# Patient Record
Sex: Male | Born: 1960 | Race: White | Hispanic: No | Marital: Married | Smoking: Never smoker
Health system: Southern US, Community
[De-identification: ages and names within clinical notes are randomized; demographics above are authoritative.]

## PROBLEM LIST (undated history)

## (undated) DIAGNOSIS — N400 Enlarged prostate without lower urinary tract symptoms: Secondary | ICD-10-CM

## (undated) DIAGNOSIS — R519 Headache, unspecified: Secondary | ICD-10-CM

## (undated) DIAGNOSIS — N390 Urinary tract infection, site not specified: Principal | ICD-10-CM

## (undated) DIAGNOSIS — A419 Sepsis, unspecified organism: Principal | ICD-10-CM

## (undated) DIAGNOSIS — T7840XA Allergy, unspecified, initial encounter: Secondary | ICD-10-CM

## (undated) DIAGNOSIS — M1990 Unspecified osteoarthritis, unspecified site: Secondary | ICD-10-CM

## (undated) DIAGNOSIS — R972 Elevated prostate specific antigen [PSA]: Secondary | ICD-10-CM

## (undated) DIAGNOSIS — Z86010 Personal history of colonic polyps: Secondary | ICD-10-CM

## (undated) HISTORY — DX: Urinary tract infection, site not specified: N39.0

## (undated) HISTORY — DX: Allergy, unspecified, initial encounter: T78.40XA

## (undated) HISTORY — DX: Sepsis, unspecified organism: A41.9

## (undated) HISTORY — DX: Elevated prostate specific antigen (PSA): R97.20

## (undated) HISTORY — DX: Benign prostatic hyperplasia without lower urinary tract symptoms: N40.0

## (undated) HISTORY — DX: Headache, unspecified: R51.9

## (undated) HISTORY — DX: Personal history of colonic polyps: Z86.010

## (undated) HISTORY — DX: Unspecified osteoarthritis, unspecified site: M19.90

---

## 1994-08-12 HISTORY — PX: HERNIA REPAIR: SHX51

## 1999-09-09 ENCOUNTER — Emergency Department (HOSPITAL_COMMUNITY): Admission: EM | Admit: 1999-09-09 | Discharge: 1999-09-09 | Payer: Self-pay

## 2005-07-15 ENCOUNTER — Ambulatory Visit: Payer: Self-pay | Admitting: Family Medicine

## 2006-01-01 ENCOUNTER — Ambulatory Visit: Payer: Self-pay | Admitting: Family Medicine

## 2006-03-04 ENCOUNTER — Ambulatory Visit: Payer: Self-pay | Admitting: Family Medicine

## 2006-03-21 ENCOUNTER — Ambulatory Visit: Payer: Self-pay | Admitting: Family Medicine

## 2006-06-10 ENCOUNTER — Ambulatory Visit: Payer: Self-pay | Admitting: Family Medicine

## 2006-08-15 ENCOUNTER — Ambulatory Visit: Payer: Self-pay | Admitting: Family Medicine

## 2006-08-15 LAB — CONVERTED CEMR LAB
ALT: 29 units/L (ref 0–40)
Albumin: 3.9 g/dL (ref 3.5–5.2)
Alkaline Phosphatase: 62 units/L (ref 39–117)
Basophils Absolute: 0 10*3/uL (ref 0.0–0.1)
CO2: 30 meq/L (ref 19–32)
Calcium: 9.3 mg/dL (ref 8.4–10.5)
Cholesterol: 189 mg/dL (ref 0–200)
Creatinine, Ser: 1.1 mg/dL (ref 0.4–1.5)
GFR calc non Af Amer: 77 mL/min
HCT: 44.1 % (ref 39.0–52.0)
Lymphocytes Relative: 34.2 % (ref 12.0–46.0)
Total Bilirubin: 1.1 mg/dL (ref 0.3–1.2)
Triglyceride fasting, serum: 159 mg/dL — ABNORMAL HIGH (ref 0–149)
VLDL: 32 mg/dL (ref 0–40)
WBC: 6.2 10*3/uL (ref 4.5–10.5)

## 2006-08-22 ENCOUNTER — Ambulatory Visit: Payer: Self-pay | Admitting: Family Medicine

## 2006-09-03 ENCOUNTER — Encounter: Admission: RE | Admit: 2006-09-03 | Discharge: 2006-09-03 | Payer: Self-pay | Admitting: Family Medicine

## 2007-05-15 ENCOUNTER — Ambulatory Visit: Payer: Self-pay | Admitting: Family Medicine

## 2007-05-15 DIAGNOSIS — J019 Acute sinusitis, unspecified: Secondary | ICD-10-CM

## 2007-08-27 IMAGING — US US SCROTUM
1 series · 14 of 25 positions shown · non-contrast
Comparison: None.

CLINICAL DATA: Right scrotal mass. 
 SCROTAL ULTRASOUND:
TECHNIQUE: Complete ultrasound examination of the testicles, epididymis, and other scrotal structures was performed.

[Series 1: us scrotum · 0.08mm/px · 14 of 43 slices shown]
[im 1/43]
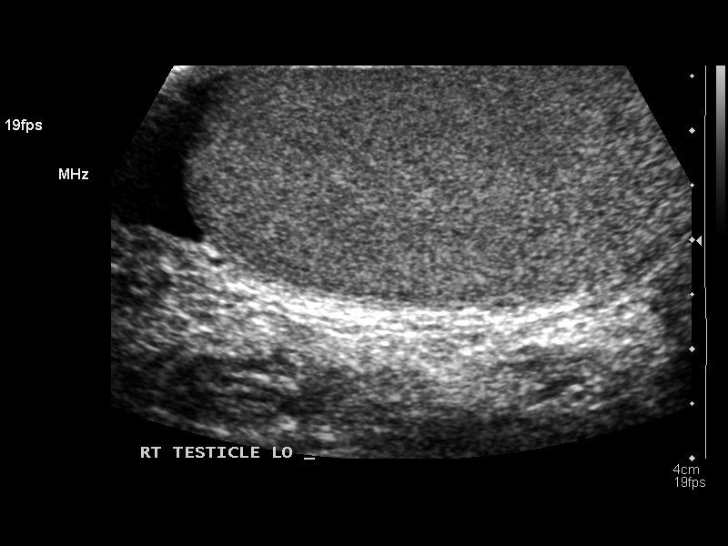
[im 4/43]
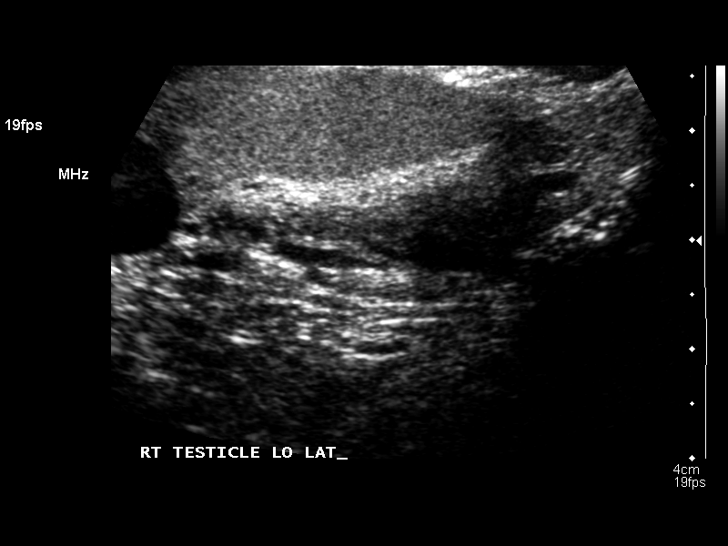
[im 8/43]
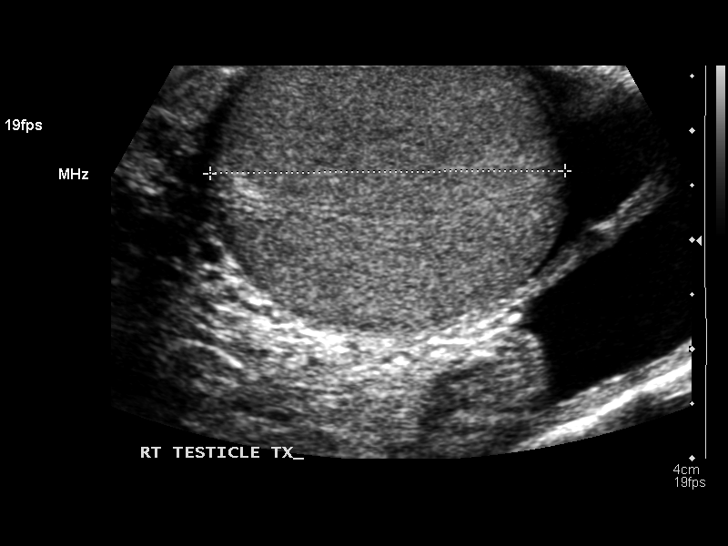
[im 11/43]
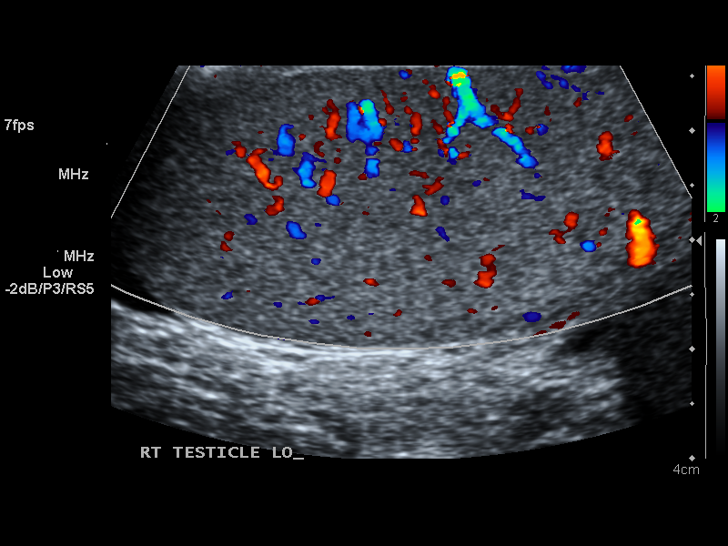
[im 15/43]
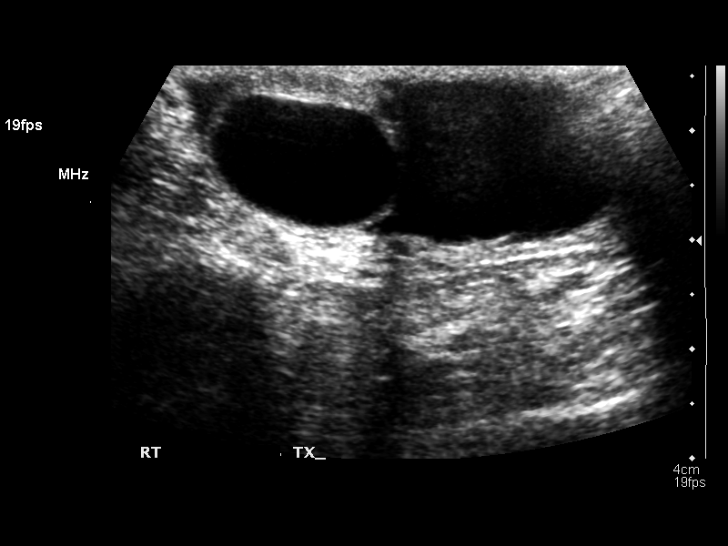
[im 16/43]
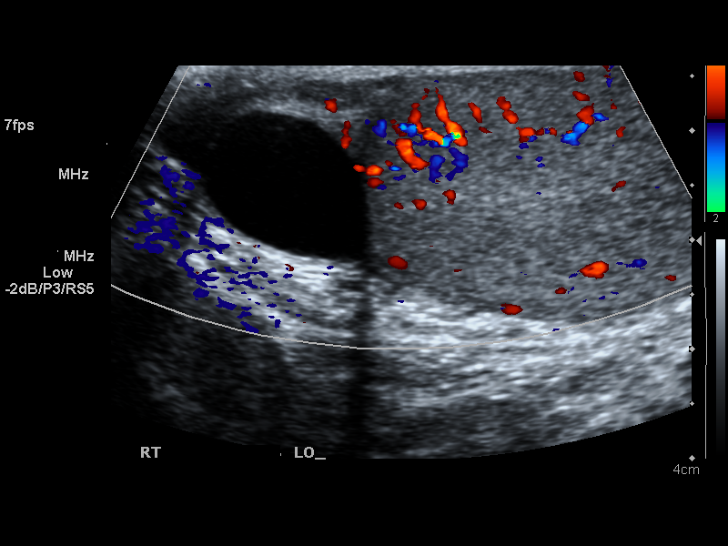
[im 20/43]
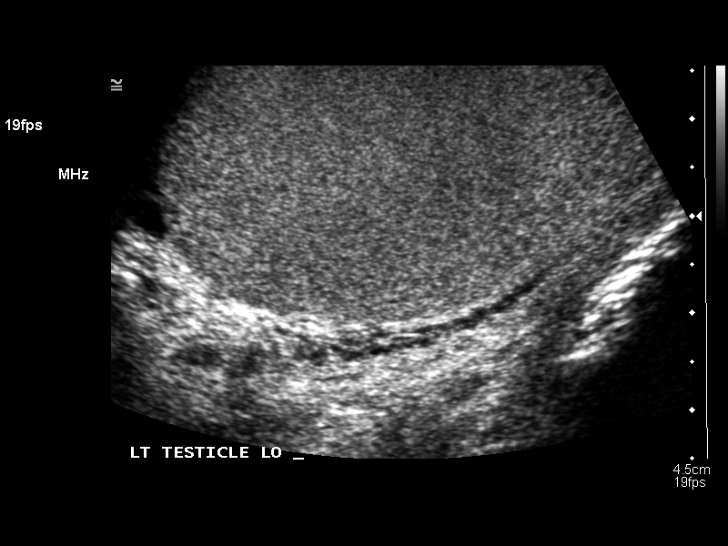
[im 23/43]
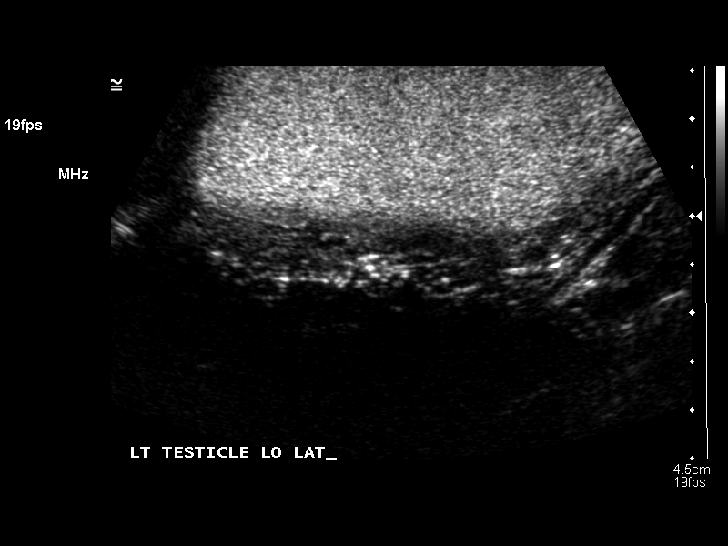
[im 27/43]
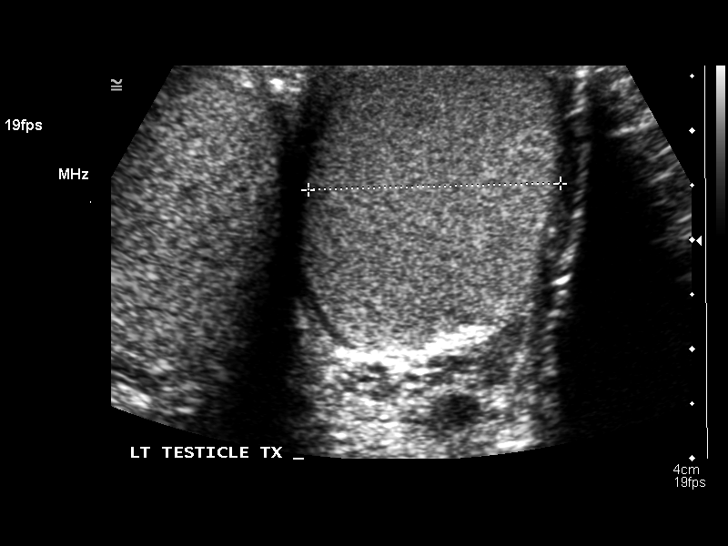
[im 29/43]
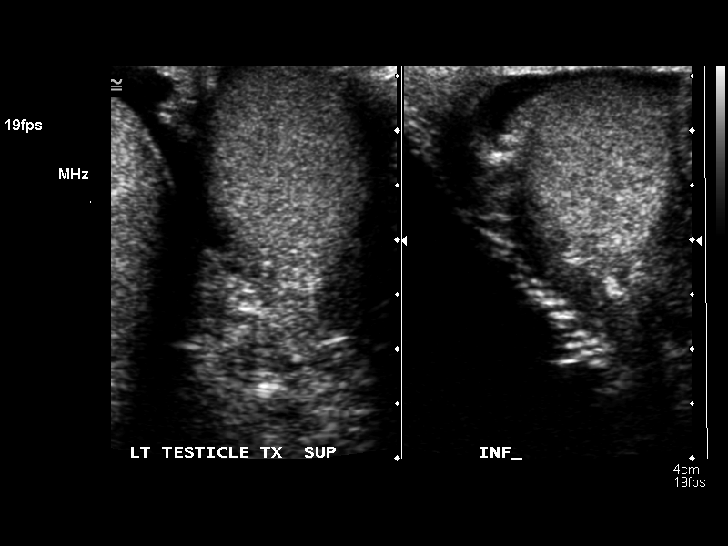
[im 32/43]
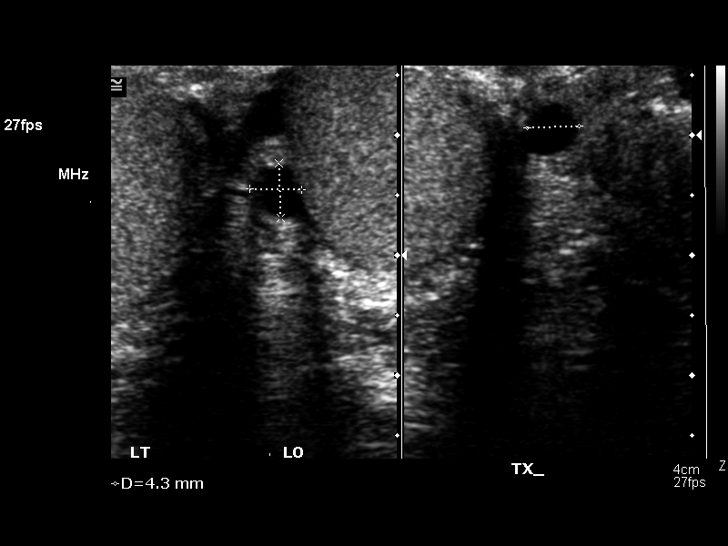
[im 36/43]
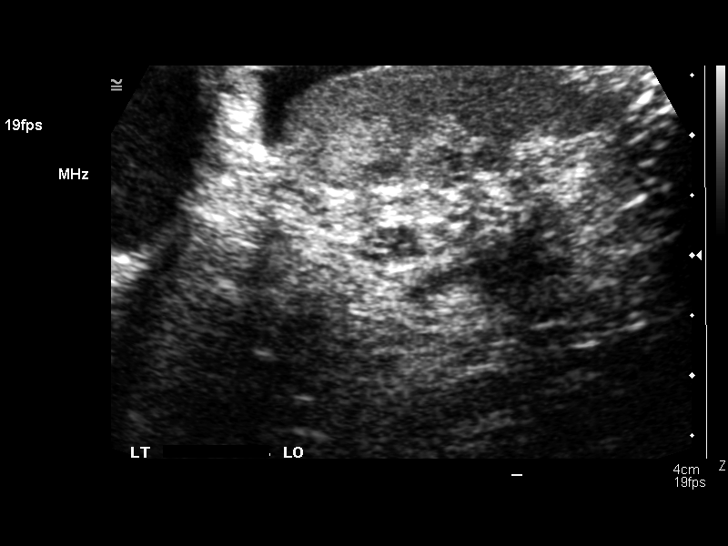
[im 39/43]
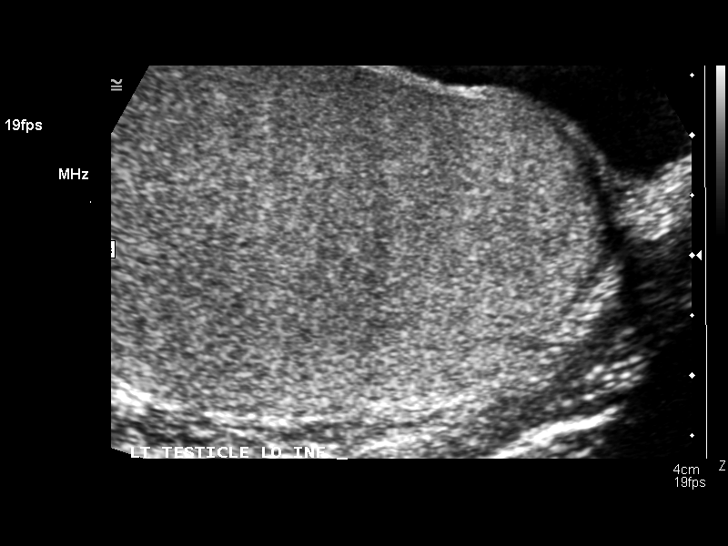
[im 43/43]
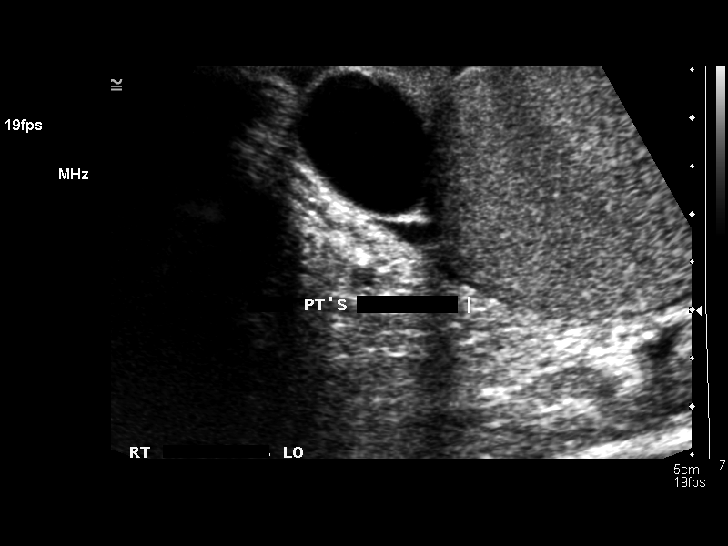

[14 of 25 positions shown; findings below may reference images not displayed]

FINDINGS: Right testicle measures 5.4 x 2.4 x 3.3 cm.  Flow is seen within.  Parenchymal echo texture is uniform.  There is 1.8 x 1.3 x 1.8 cm anechoic lesion with increased through transmission in the right epididymis.  A small amount of fluid is seen in the right scrotal sac.  
 Left testicle measures 5.5 x 2.9 x 2.3 cm.  Flow is seen within.  Parenchymal echo texture is uniform.  There are two subcentimeter anechoic lesions with increased through transmission in the epididymis.  One of these has internal echoes.  A small amount of fluid is seen about the left testicle.  No varicocele bilaterally.
IMPRESSION: 1.  Bilateral spermatoceles or epididymal cysts.   One of these contains internal debris on the left. 
 2.  Small bilateral hydroceles.

## 2007-09-14 ENCOUNTER — Ambulatory Visit: Payer: Self-pay | Admitting: Family Medicine

## 2007-09-14 DIAGNOSIS — IMO0002 Reserved for concepts with insufficient information to code with codable children: Secondary | ICD-10-CM

## 2008-03-03 ENCOUNTER — Telehealth: Payer: Self-pay | Admitting: *Deleted

## 2008-03-04 ENCOUNTER — Ambulatory Visit: Payer: Self-pay | Admitting: Internal Medicine

## 2008-03-04 DIAGNOSIS — R079 Chest pain, unspecified: Secondary | ICD-10-CM | POA: Insufficient documentation

## 2008-07-03 ENCOUNTER — Emergency Department (HOSPITAL_COMMUNITY): Admission: EM | Admit: 2008-07-03 | Discharge: 2008-07-03 | Payer: Self-pay | Admitting: Emergency Medicine

## 2008-07-04 ENCOUNTER — Ambulatory Visit: Payer: Self-pay | Admitting: Family Medicine

## 2008-07-04 DIAGNOSIS — N401 Enlarged prostate with lower urinary tract symptoms: Secondary | ICD-10-CM

## 2008-08-12 HISTORY — PX: NASAL SEPTUM SURGERY: SHX37

## 2008-09-12 ENCOUNTER — Encounter: Payer: Self-pay | Admitting: Family Medicine

## 2009-01-02 ENCOUNTER — Ambulatory Visit: Payer: Self-pay | Admitting: Family Medicine

## 2009-01-02 LAB — CONVERTED CEMR LAB
Nitrite: NEGATIVE
Specific Gravity, Urine: 1.02

## 2009-01-05 ENCOUNTER — Encounter: Payer: Self-pay | Admitting: Family Medicine

## 2009-01-05 LAB — CONVERTED CEMR LAB
BUN: 12 mg/dL (ref 6–23)
Basophils Absolute: 0 10*3/uL (ref 0.0–0.1)
Bilirubin, Direct: 0.2 mg/dL (ref 0.0–0.3)
CO2: 28 meq/L (ref 19–32)
Calcium: 8.9 mg/dL (ref 8.4–10.5)
Chloride: 107 meq/L (ref 96–112)
Cholesterol: 195 mg/dL (ref 0–200)
Creatinine, Ser: 1 mg/dL (ref 0.4–1.5)
GFR calc non Af Amer: 84.4 mL/min (ref >60)
Glucose, Bld: 87 mg/dL (ref 70–99)
Hemoglobin: 14.8 g/dL (ref 13.0–17.0)
Monocytes Absolute: 0.4 10*3/uL (ref 0.1–1.0)
Monocytes Relative: 8.2 % (ref 3.0–12.0)
Neutrophils Relative %: 49.5 % (ref 43.0–77.0)
Platelets: 190 10*3/uL (ref 150.0–400.0)
Potassium: 3.8 meq/L (ref 3.5–5.1)
RBC: 4.64 M/uL (ref 4.22–5.81)
RDW: 12.4 % (ref 11.5–14.6)
Sodium: 141 meq/L (ref 135–145)
TSH: 2.46 microintl units/mL (ref 0.35–5.50)
Total Bilirubin: 1.1 mg/dL (ref 0.3–1.2)
Total CHOL/HDL Ratio: 6
Total Protein: 6.7 g/dL (ref 6.0–8.3)
VLDL: 30.8 mg/dL (ref 0.0–40.0)
WBC: 5.1 10*3/uL (ref 4.5–10.5)

## 2009-01-17 ENCOUNTER — Ambulatory Visit: Payer: Self-pay | Admitting: Family Medicine

## 2009-07-10 ENCOUNTER — Ambulatory Visit: Payer: Self-pay | Admitting: Family Medicine

## 2009-07-10 DIAGNOSIS — J3489 Other specified disorders of nose and nasal sinuses: Secondary | ICD-10-CM

## 2009-07-13 ENCOUNTER — Encounter (INDEPENDENT_AMBULATORY_CARE_PROVIDER_SITE_OTHER): Payer: Self-pay | Admitting: *Deleted

## 2009-08-09 ENCOUNTER — Encounter: Payer: Self-pay | Admitting: Family Medicine

## 2009-08-10 ENCOUNTER — Encounter: Payer: Self-pay | Admitting: Family Medicine

## 2009-08-18 ENCOUNTER — Encounter: Payer: Self-pay | Admitting: Family Medicine

## 2009-08-28 ENCOUNTER — Encounter: Payer: Self-pay | Admitting: Family Medicine

## 2009-09-29 ENCOUNTER — Encounter: Payer: Self-pay | Admitting: Family Medicine

## 2009-10-19 ENCOUNTER — Encounter: Payer: Self-pay | Admitting: Family Medicine

## 2009-12-27 ENCOUNTER — Ambulatory Visit: Payer: Self-pay | Admitting: Family Medicine

## 2010-09-13 NOTE — Letter (Signed)
Summary: [facility]  [facility]   Imported By: Maryln Gottron 10/25/2009 09:23:01  _____________________________________________________________________  External Attachment:    Type:   Image     Comment:   External Document

## 2010-09-13 NOTE — Letter (Signed)
Summary: [facility]  [facility]   Imported By: Maryln Gottron 08/25/2009 13:46:27  _____________________________________________________________________  External Attachment:    Type:   Image     Comment:   External Document

## 2010-09-13 NOTE — Assessment & Plan Note (Signed)
Summary: sinus/headache/sorethroat/cjr   Vital Signs:  Patient profile:   50 year old male Weight:      174 pounds BMI:     25.79 Temp:     98.9 degrees F oral BP sitting:   110 / 86  (left arm) Cuff size:   regular  Vitals Entered By: Raechel Ache, RN (Dec 27, 2009 2:55 PM) CC: C/o head congestion, sore throat, drainage and some cough x 3 weeks.   History of Present Illness: Here for 2 weeks of sinus pressure, PND, HA, dry cough, and blowing yellow mucus from the nose. No fever. He saw Urgent Care and was given Omnicef, but this did not help.   Allergies (verified): No Known Drug Allergies  Past History:  Past Medical History: Reviewed history from 01/17/2009 and no changes required. Unremarkable  Past Surgical History: Hernia inguinal Sinus surgery  Review of Systems  The patient denies anorexia, fever, weight loss, weight gain, vision loss, decreased hearing, hoarseness, chest pain, syncope, dyspnea on exertion, peripheral edema, hemoptysis, abdominal pain, melena, hematochezia, severe indigestion/heartburn, hematuria, incontinence, genital sores, muscle weakness, suspicious skin lesions, transient blindness, difficulty walking, depression, unusual weight change, abnormal bleeding, enlarged lymph nodes, angioedema, breast masses, and testicular masses.    Physical Exam  General:  Well-developed,well-nourished,in no acute distress; alert,appropriate and cooperative throughout examination Head:  Normocephalic and atraumatic without obvious abnormalities. No apparent alopecia or balding. Eyes:  No corneal or conjunctival inflammation noted. EOMI. Perrla. Funduscopic exam benign, without hemorrhages, exudates or papilledema. Vision grossly normal. Ears:  External ear exam shows no significant lesions or deformities.  Otoscopic examination reveals clear canals, tympanic membranes are intact bilaterally without bulging, retraction, inflammation or discharge. Hearing is grossly  normal bilaterally. Nose:  External nasal examination shows no deformity or inflammation. Nasal mucosa are pink and moist without lesions or exudates. Mouth:  Oral mucosa and oropharynx without lesions or exudates.  Teeth in good repair. Neck:  No deformities, masses, or tenderness noted. Lungs:  Normal respiratory effort, chest expands symmetrically. Lungs are clear to auscultation, no crackles or wheezes.   Impression & Recommendations:  Problem # 1:  SINUSITIS, ACUTE NOS (ICD-461.9)  His updated medication list for this problem includes:    Biaxin 500 Mg Tabs (Clarithromycin) .Marland Kitchen..Marland Kitchen Two times a day  Complete Medication List: 1)  Percocet 5-325 Mg Tabs (Oxycodone-acetaminophen) .Marland Kitchen.. 1 q 6 hours as needed pain 2)  Biaxin 500 Mg Tabs (Clarithromycin) .... Two times a day  Patient Instructions: 1)  Please schedule a follow-up appointment as needed .  Prescriptions: BIAXIN 500 MG TABS (CLARITHROMYCIN) two times a day  #28 x 0   Entered and Authorized by:   Nelwyn Salisbury MD   Signed by:   Nelwyn Salisbury MD on 12/27/2009   Method used:   Electronically to        [facility]. [phone]* (retail)       [address]       [place], [place]  [address]       Ph: [phone]       Fax: [phone]   RxID:   [id number]

## 2010-09-13 NOTE — Op Note (Signed)
Summary: Nasal Septoplasty-Dr. Flo Shanks  Nasal Septoplasty-Dr. Flo Shanks   Imported By: Maryln Gottron 08/17/2009 10:43:36  _____________________________________________________________________  External Attachment:    Type:   Image     Comment:   External Document

## 2010-09-13 NOTE — Letter (Signed)
Summary: [facility]  [facility]   Imported By: Maryln Gottron 08/30/2009 15:19:47  _____________________________________________________________________  External Attachment:    Type:   Image     Comment:   External Document

## 2010-09-13 NOTE — Letter (Signed)
Summary: [facility]  [facility]   Imported By: Maryln Gottron 10/06/2009 09:47:11  _____________________________________________________________________  External Attachment:    Type:   Image     Comment:   External Document

## 2010-09-13 NOTE — Letter (Signed)
Summary: [facility]  [facility]   Imported By: Maryln Gottron 08/17/2009 11:22:28  _____________________________________________________________________  External Attachment:    Type:   Image     Comment:   External Document

## 2010-10-05 ENCOUNTER — Ambulatory Visit (INDEPENDENT_AMBULATORY_CARE_PROVIDER_SITE_OTHER): Payer: 59 | Admitting: Family Medicine

## 2010-10-05 ENCOUNTER — Encounter: Payer: Self-pay | Admitting: Family Medicine

## 2010-10-05 VITALS — BP 130/82 | HR 83 | Temp 98.1°F

## 2010-10-05 DIAGNOSIS — J329 Chronic sinusitis, unspecified: Secondary | ICD-10-CM

## 2010-10-05 MED ORDER — HYDROCODONE-HOMATROPINE 5-1.5 MG/5ML PO SYRP: 5.0000 mL | ORAL_SOLUTION | Freq: Four times a day (QID) | ORAL | Status: AC | PRN

## 2010-10-05 MED ORDER — AMOXICILLIN-POT CLAVULANATE 875-125 MG PO TABS: 1.0000 | ORAL_TABLET | Freq: Two times a day (BID) | ORAL | Status: AC

## 2010-10-05 NOTE — Progress Notes (Signed)
  Subjective:    Patient ID: Brandon Cochran, male    DOB: 1960/08/23, 50 y.o.   MRN: [id number]  HPI Here for 2 weeks of sinus pressure, PND, ST, HA, and a dry cough. No fever.    Review of Systems  Constitutional: Negative.   HENT: Positive for nosebleeds, sore throat and sinus pressure.   Respiratory: Positive for cough. Negative for choking, chest tightness and shortness of breath.        Objective:   Physical Exam  Constitutional: He appears well-developed and well-nourished. No distress.  HENT:  Head: Normocephalic and atraumatic.  Right Ear: External ear normal.  Left Ear: External ear normal.  Nose: Nose normal.  Mouth/Throat: No oropharyngeal exudate.  Eyes: Conjunctivae are normal. Pupils are equal, round, and reactive to light.  Pulmonary/Chest: Effort normal and breath sounds normal. No respiratory distress. He has no wheezes. He has no rales. He exhibits no tenderness.  Lymphadenopathy:    He has no cervical adenopathy.          Assessment & Plan:  Rest, fluids.

## 2010-12-28 NOTE — Assessment & Plan Note (Signed)
[facility] OFFICE NOTE   WITTEN, CERTAIN                     MRN:          [id number]  DATE:08/22/2006                            DOB:          [birth year]    This is a 50 year old gentleman here for a complete physical  examination.  He has a couple of things to discuss.  First off he asks  me to check a lump that has been present in his right scrotum ever since  he was a teenager.  It never bothers him.  It does not seem to change at  all.  He says it has never been looked at with an ultrasound to his  knowledge.  Also he continues to have some stiffness and pain in the  lower back, especially when he gets up in the morning.  It does ease up  later on in the day once he gets moving around.  We have tried etodolac  for this, but it has not seemed to help much.  For further details of  his past medical history, family history, social history, habits, etc.,  I refer you to last physical note dated March 08, 2004.   ALLERGIES:  NONE.   CURRENT MEDICATIONS:  None.   OBJECTIVE:  Height 5 foot 9 inches, weight 184, blood pressure 122/82,  pulse 80 and regular.  GENERAL:  Appears to be healthy.  SKIN:  Clear.  EYES:  Clear.  EARS:  Clear.  PHARYNX:  Clear.  NECK:  Supple without lymphadenopathy, masses.  LUNGS:  Clear.  CARDIAC:  Rate and rhythm regular without gallops, murmurs, or rubs.  Distal pulses full.  ABDOMEN:  Soft, normal bowel sounds, nontender, no masses.  GENITALIA:  Circumcised, testicles are unremarkable.  Scrotum is  remarkable for a nontender, well circumscribed 0.5-cm lump on the right  scrotum, just superior to the testicle.  EXTREMITIES:  No clubbing, cyanosis, or edema.  NEUROLOGIC:  Grossly intact.  He was here for fasting labs on January 4. These were remarkable only  for mildly abnormal lipid panel.  Triglycerides were high at 159, HDL  low at 35, and LDL high at 122.   ASSESSMENT/PLAN:  1. Complete physical.  I will plan on repeating this in a year.  2. Low back pain.  Will try Flexeril 10 mg t.i.d. as needed,      especially at bedtime.  3. Hyperlipidemia.  Talked about watching his diet more closely.  4. Right scrotal mass, probably representing a benign epididymal cyst.      We will set him up for a scrotal ultrasound soon.    Tera Mater. Clent Ridges, MD  Electronically Signed   SAF/MedQ  DD: 08/22/2006  DT: 08/23/2006  Job #: [id number]

## 2011-02-19 ENCOUNTER — Other Ambulatory Visit (INDEPENDENT_AMBULATORY_CARE_PROVIDER_SITE_OTHER): Payer: 59

## 2011-02-19 DIAGNOSIS — Z0000 Encounter for general adult medical examination without abnormal findings: Secondary | ICD-10-CM

## 2011-02-19 LAB — CBC WITH DIFFERENTIAL/PLATELET
Basophils Relative: 0.7 % (ref 0.0–3.0)
Eosinophils Absolute: 0.4 10*3/uL (ref 0.0–0.7)
HCT: 45.1 % (ref 39.0–52.0)
MCV: 90.3 fl (ref 78.0–100.0)
Monocytes Absolute: 0.5 10*3/uL (ref 0.1–1.0)
Neutro Abs: 3.6 10*3/uL (ref 1.4–7.7)
Neutrophils Relative %: 51.1 % (ref 43.0–77.0)
Platelets: 244 10*3/uL (ref 150.0–400.0)
RDW: 13.4 % (ref 11.5–14.6)
WBC: 7.1 10*3/uL (ref 4.5–10.5)

## 2011-02-19 LAB — HEPATIC FUNCTION PANEL
AST: 20 U/L (ref 0–37)
Albumin: 4.3 g/dL (ref 3.5–5.2)
Total Bilirubin: 0.6 mg/dL (ref 0.3–1.2)

## 2011-02-19 LAB — POCT URINALYSIS DIPSTICK
Blood, UA: NEGATIVE
Spec Grav, UA: 1.015

## 2011-02-19 LAB — LIPID PANEL
Cholesterol: 181 mg/dL (ref 0–200)
HDL: 40.3 mg/dL (ref >39.00)
Total CHOL/HDL Ratio: 4
Triglycerides: 168 mg/dL — ABNORMAL HIGH (ref 0.0–149.0)

## 2011-02-19 LAB — BASIC METABOLIC PANEL
BUN: 15 mg/dL (ref 6–23)
Creatinine, Ser: 1.1 mg/dL (ref 0.4–1.5)

## 2011-02-19 LAB — PSA: PSA: 5.95 ng/mL — ABNORMAL HIGH (ref 0.10–4.00)

## 2011-02-26 ENCOUNTER — Ambulatory Visit (INDEPENDENT_AMBULATORY_CARE_PROVIDER_SITE_OTHER): Payer: 59 | Admitting: Family Medicine

## 2011-02-26 ENCOUNTER — Encounter: Payer: Self-pay | Admitting: Family Medicine

## 2011-02-26 VITALS — BP 110/80 | HR 89 | Temp 98.3°F | Ht 69.0 in | Wt 178.0 lb

## 2011-02-26 DIAGNOSIS — Z0000 Encounter for general adult medical examination without abnormal findings: Secondary | ICD-10-CM

## 2011-02-26 DIAGNOSIS — R972 Elevated prostate specific antigen [PSA]: Secondary | ICD-10-CM

## 2011-02-26 NOTE — Progress Notes (Signed)
  Subjective:    Patient ID: Brandon Cochran, male    DOB: 1960-11-25, 50 y.o.   MRN: [id number]  HPI 50 yr old male for a cpx. He feels fine and has no concerns.    Review of Systems  Constitutional: Negative.   HENT: Negative.   Eyes: Negative.   Respiratory: Negative.   Cardiovascular: Negative.   Gastrointestinal: Negative.   Genitourinary: Negative.   Musculoskeletal: Negative.   Skin: Negative.   Neurological: Negative.   Hematological: Negative.   Psychiatric/Behavioral: Negative.        Objective:   Physical Exam  Constitutional: He is oriented to person, place, and time. He appears well-developed and well-nourished. No distress.  HENT:  Head: Normocephalic and atraumatic.  Right Ear: External ear normal.  Left Ear: External ear normal.  Nose: Nose normal.  Mouth/Throat: Oropharynx is clear and moist. No oropharyngeal exudate.  Eyes: Conjunctivae and EOM are normal. Pupils are equal, round, and reactive to light. Right eye exhibits no discharge. Left eye exhibits no discharge. No scleral icterus.  Neck: Neck supple. No JVD present. No tracheal deviation present. No thyromegaly present.  Cardiovascular: Normal rate, regular rhythm, normal heart sounds and intact distal pulses.  Exam reveals no gallop and no friction rub.   No murmur heard.      EKG normal  Pulmonary/Chest: Effort normal and breath sounds normal. No respiratory distress. He has no wheezes. He has no rales. He exhibits no tenderness.  Abdominal: Soft. Bowel sounds are normal. He exhibits no distension and no mass. There is no tenderness. There is no rebound and no guarding.  Genitourinary: Rectum normal and penis normal. Guaiac negative stool. No penile tenderness.       Prostate is mildly enlarged but smooth, firm but not hard, not tender   Musculoskeletal: Normal range of motion. He exhibits no edema and no tenderness.  Lymphadenopathy:    He has no cervical adenopathy.  Neurological: He is alert  and oriented to person, place, and time. He has normal reflexes. No cranial nerve deficit. He exhibits normal muscle tone. Coordination normal.  Skin: Skin is warm and dry. No rash noted. He is not diaphoretic. No erythema. No pallor.  Psychiatric: He has a normal mood and affect. His behavior is normal. Judgment and thought content normal.          Assessment & Plan:  His first PSA is elevated at 5.95 so we will refer him to Urology. Set up his first colonoscopy

## 2011-03-13 DIAGNOSIS — A419 Sepsis, unspecified organism: Secondary | ICD-10-CM

## 2011-03-13 HISTORY — DX: Sepsis, unspecified organism: A41.9

## 2011-04-08 HISTORY — PX: PROSTATE BIOPSY: SHX241

## 2011-04-10 ENCOUNTER — Emergency Department (HOSPITAL_COMMUNITY): Payer: 59

## 2011-04-10 ENCOUNTER — Inpatient Hospital Stay (HOSPITAL_COMMUNITY)
Admission: EM | Admit: 2011-04-10 | Discharge: 2011-04-13 | DRG: 728 | Disposition: A | Discharge status: Home / Self Care | Payer: 59 | Attending: Urology | Admitting: Urology

## 2011-04-10 DIAGNOSIS — N410 Acute prostatitis: Secondary | ICD-10-CM

## 2011-04-10 DIAGNOSIS — A498 Other bacterial infections of unspecified site: Secondary | ICD-10-CM | POA: Diagnosis present

## 2011-04-10 DIAGNOSIS — R0902 Hypoxemia: Secondary | ICD-10-CM | POA: Diagnosis not present

## 2011-04-10 DIAGNOSIS — N390 Urinary tract infection, site not specified: Secondary | ICD-10-CM | POA: Diagnosis present

## 2011-04-10 DIAGNOSIS — R579 Shock, unspecified: Secondary | ICD-10-CM

## 2011-04-10 DIAGNOSIS — R51 Headache: Secondary | ICD-10-CM | POA: Diagnosis present

## 2011-04-10 DIAGNOSIS — R509 Fever, unspecified: Secondary | ICD-10-CM | POA: Diagnosis present

## 2011-04-10 DIAGNOSIS — Z9889 Other specified postprocedural states: Secondary | ICD-10-CM

## 2011-04-10 DIAGNOSIS — E861 Hypovolemia: Secondary | ICD-10-CM | POA: Diagnosis not present

## 2011-04-10 DIAGNOSIS — E876 Hypokalemia: Secondary | ICD-10-CM | POA: Diagnosis not present

## 2011-04-10 LAB — CBC
Hemoglobin: 13.9 g/dL (ref 13.0–17.0)
MCHC: 34.9 g/dL (ref 30.0–36.0)
MCV: 86 fL (ref 78.0–100.0)
RDW: 13.2 % (ref 11.5–15.5)
WBC: 14.2 10*3/uL — ABNORMAL HIGH (ref 4.0–10.5)

## 2011-04-10 LAB — BASIC METABOLIC PANEL
Creatinine, Ser: 1.24 mg/dL (ref 0.50–1.35)
GFR calc Af Amer: >60 mL/min (ref >60)
Glucose, Bld: 109 mg/dL — ABNORMAL HIGH (ref 70–99)
Sodium: 138 mEq/L (ref 135–145)

## 2011-04-10 LAB — URINE MICROSCOPIC-ADD ON

## 2011-04-10 LAB — DIFFERENTIAL
Basophils Relative: 0 % (ref 0–1)
Eosinophils Absolute: 0 10*3/uL (ref 0.0–0.7)
Eosinophils Relative: 0 % (ref 0–5)
Lymphocytes Relative: 3 % — ABNORMAL LOW (ref 12–46)
Lymphs Abs: 0.4 10*3/uL — ABNORMAL LOW (ref 0.7–4.0)
Neutro Abs: 13.4 10*3/uL — ABNORMAL HIGH (ref 1.7–7.7)

## 2011-04-10 LAB — URINALYSIS, ROUTINE W REFLEX MICROSCOPIC
Ketones, ur: 15 mg/dL — AB
Nitrite: NEGATIVE
Urobilinogen, UA: 0.2 mg/dL (ref 0.0–1.0)
pH: 5.5 (ref 5.0–8.0)

## 2011-04-11 DIAGNOSIS — R579 Shock, unspecified: Secondary | ICD-10-CM

## 2011-04-11 DIAGNOSIS — N410 Acute prostatitis: Secondary | ICD-10-CM

## 2011-04-11 DIAGNOSIS — R7881 Bacteremia: Secondary | ICD-10-CM

## 2011-04-11 DIAGNOSIS — N390 Urinary tract infection, site not specified: Secondary | ICD-10-CM

## 2011-04-11 LAB — CBC
Hemoglobin: 11.6 g/dL — ABNORMAL LOW (ref 13.0–17.0)
MCH: 30.9 pg (ref 26.0–34.0)
MCV: 84.5 fL (ref 78.0–100.0)
RBC: 3.75 MIL/uL — ABNORMAL LOW (ref 4.22–5.81)
RDW: 13.5 % (ref 11.5–15.5)

## 2011-04-11 LAB — COMPREHENSIVE METABOLIC PANEL
ALT: 23 U/L (ref 0–53)
Albumin: 2.5 g/dL — ABNORMAL LOW (ref 3.5–5.2)
CO2: 23 mEq/L (ref 19–32)
Chloride: 107 mEq/L (ref 96–112)
GFR calc Af Amer: >60 mL/min (ref >60)
Glucose, Bld: 109 mg/dL — ABNORMAL HIGH (ref 70–99)
Potassium: 3.3 mEq/L — ABNORMAL LOW (ref 3.5–5.1)
Total Bilirubin: 1 mg/dL (ref 0.3–1.2)

## 2011-04-11 LAB — LACTIC ACID, PLASMA: Lactic Acid, Venous: 1.2 mmol/L (ref 0.5–2.2)

## 2011-04-12 LAB — COMPREHENSIVE METABOLIC PANEL
AST: 24 U/L (ref 0–37)
Albumin: 2.6 g/dL — ABNORMAL LOW (ref 3.5–5.2)
Alkaline Phosphatase: 57 U/L (ref 39–117)
BUN: 9 mg/dL (ref 6–23)
Chloride: 108 mEq/L (ref 96–112)
Creatinine, Ser: 0.83 mg/dL (ref 0.50–1.35)
Potassium: 4.2 mEq/L (ref 3.5–5.1)
Total Bilirubin: 0.7 mg/dL (ref 0.3–1.2)
Total Protein: 5.6 g/dL — ABNORMAL LOW (ref 6.0–8.3)

## 2011-04-12 LAB — CBC
HCT: 32.5 % — ABNORMAL LOW (ref 39.0–52.0)
Hemoglobin: 11.5 g/dL — ABNORMAL LOW (ref 13.0–17.0)
MCH: 30 pg (ref 26.0–34.0)
MCV: 84.9 fL (ref 78.0–100.0)
Platelets: 120 10*3/uL — ABNORMAL LOW (ref 150–400)
RBC: 3.83 MIL/uL — ABNORMAL LOW (ref 4.22–5.81)
RDW: 13.3 % (ref 11.5–15.5)

## 2011-04-12 LAB — URINE CULTURE
Colony Count: 8000
Culture  Setup Time: 201208300156

## 2011-04-12 NOTE — H&P (Signed)
NAMETRAYDEN, Brandon Cochran NO.:  [id number]  MEDICAL RECORD NO.:  [id number]  LOCATION:  [id number]                         FACILITY:  [facility]  PHYSICIAN:  Sigmund I. Patsi Sears, M.D.DATE OF BIRTH:  01/15/1961  DATE OF ADMISSION:  04/10/2011 DATE OF DISCHARGE:                             HISTORY & PHYSICAL   HISTORY:  Brandon Cochran is a 50 year old male, 2 days status post transrectal needle biopsy of the prostate per Dr. Isabel Caprice for evaluation of elevated PSA.  The patient's PSA was 2.86 in 2010, rising to 5, and 7 in 2012.  Postbiopsy, the patient developed headache, nausea, vomiting, beginning yesterday, 24 hours after biopsy.  He had previously been treated with Levaquin as pre and post biopsy antibiotic.  The patient developed a temperature of 100, with vomiting 1 time.  The patient notes that the Levaquin causes severe nausea.  The patient was brought to [facility] Emergency Room, where he was noted to be short of breath, complaining of headache migraine type, as well as constipation, nausea, vomiting.  He complained of chills, with general malaise, dizziness, and shivering.  Evaluation in the emergency room showed no cough, no wheezing, and no chest pain.  The patient had no flank pain, appeared to be alert and oriented.  He was noted in the emergency room to have general malaise, and had normal O2 saturations of 99%.  Blood pressure was 130/92, but evaluation in the emergency room noted to drop his blood pressure to 94 systolic.  The patient's temperature was 96, but repeat was 103.  Urology was called and admission was arranged.  PAST MEDICAL HISTORY:  Noncontributory except for the HPI above.  Review of systems is significant for fever and chills, general malaise.  No cough, no shortness of breath, no leg pain.  The patient has no abdominal pain and flank pain.  Remaining review of systems is noncontributory.  ALLERGIES:  None known.  SOCIAL HISTORY:  Tobacco  and alcohol none.  FAMILY HISTORY:  Noncontributory.  PHYSICAL EXAMINATION:  GENERAL:  A well-developed, well-nourished, white male, in no acute distress. VITAL SIGNS:  Blood pressure is 96 systolic. Remaining vital signs are as noted on the chart. NECK:  Supple, nontender.  No nodes. CHEST:  Clear to P and A. ABDOMEN:  Soft, positive bowel sounds without organomegaly or masses. GENITOURINARY:  Normal male external genitalia.  The penis is normal, scrotum is normal.  The urethral meatus is normal. EXTREMITIES:  No cyanosis.  No edema. PSYCHOLOGIC:  Normal orientation to time, person, place.  ASSESSMENT:  General malaise, high fever, and low blood pressure following prostate biopsy is indicative of urosepsis postbiopsy.  I have explained the patient and his wife that it would be best for him to be admitted to hospital for at least 24 hours for IV antibiotics, and written him for Ancef and gentamicin.  Urine culture and blood cultures were obtained.  Because of this precipitous drop in blood pressure to 96, even though the patient is still alert and oriented, I will change him from regular hospital admission to ICU step-down status, and I have explained the patient that this could be more significant than it would appear at  the present time.  We have called Pharmacy for stat gentamicin dose of 120 mg.     Sigmund I. Patsi Sears, M.D.     SIT/MEDQ  D:  04/10/2011  T:  04/11/2011  Job:  [id number]  cc:   Valetta Fuller, MD Fax: [phone]  Tera Mater. Clent Ridges, MD [address] [place] [address]  Electronically Signed by Jethro Bolus M.D. on 04/12/2011 03:12:46 PM

## 2011-04-13 LAB — CULTURE, BLOOD (ROUTINE X 2): Culture  Setup Time: 201208300034

## 2011-04-19 ENCOUNTER — Other Ambulatory Visit: Payer: 59 | Admitting: Internal Medicine

## 2011-04-22 ENCOUNTER — Emergency Department (HOSPITAL_COMMUNITY)
Admission: EM | Admit: 2011-04-22 | Discharge: 2011-04-22 | Disposition: A | Discharge status: Home / Self Care | Payer: 59 | Attending: Emergency Medicine | Admitting: Emergency Medicine

## 2011-04-22 ENCOUNTER — Emergency Department (HOSPITAL_COMMUNITY): Payer: 59

## 2011-04-22 DIAGNOSIS — M25529 Pain in unspecified elbow: Secondary | ICD-10-CM | POA: Insufficient documentation

## 2011-04-22 DIAGNOSIS — M79609 Pain in unspecified limb: Secondary | ICD-10-CM

## 2011-04-24 ENCOUNTER — Encounter: Payer: Self-pay | Admitting: Family Medicine

## 2011-04-24 ENCOUNTER — Ambulatory Visit (INDEPENDENT_AMBULATORY_CARE_PROVIDER_SITE_OTHER): Payer: 59 | Admitting: Family Medicine

## 2011-04-24 VITALS — BP 102/72 | HR 74 | Temp 98.5°F | Wt 173.0 lb

## 2011-04-24 DIAGNOSIS — N390 Urinary tract infection, site not specified: Secondary | ICD-10-CM

## 2011-04-24 DIAGNOSIS — N410 Acute prostatitis: Secondary | ICD-10-CM

## 2011-04-24 DIAGNOSIS — I808 Phlebitis and thrombophlebitis of other sites: Secondary | ICD-10-CM

## 2011-04-24 MED ORDER — SULFAMETHOXAZOLE-TRIMETHOPRIM 800-160 MG PO TABS: 1.0000 | ORAL_TABLET | Freq: Two times a day (BID) | ORAL | Status: AC

## 2011-04-24 NOTE — Progress Notes (Signed)
  Subjective:    Patient ID: Brandon Cochran, male    DOB: 08-20-1960, 50 y.o.   MRN: [id number]  HPI Here to follow up a hospital stay from 04-10-11 to 04-13-11 for urosepsis after a prostate biopsy per Dr. Isabel Caprice on 04-08-11. The patient was feverish, weak, and shaky. His blood cultures grew E. Coli that is resistant to quinolones but sensitive to sulfa. He got several days of IV Gentamycin and Zosyn, then was put on oral Septra DS for 13 days. He is feeling better now with no fever, no urinary symptoms, and a normal appetite. He did developed a sharp pain in the left elbow and upper arm a few days after going home, and he went to the ER for this. An US revealed no blood clots, so he was treated with Advil and Percocet. The arm is slowly getting better now.    Review of Systems  Constitutional: Negative.   Respiratory: Negative.   Cardiovascular: Negative.   Gastrointestinal: Negative.   Genitourinary: Negative.        Objective:   Physical Exam  Constitutional: He appears well-developed and well-nourished.  Abdominal: Soft. Bowel sounds are normal. He exhibits no distension and no mass. There is no tenderness. There is no rebound and no guarding.  Musculoskeletal:       The left upper arm and antecubital area is mildly tender with no corns or swelling or erythema          Assessment & Plan:  The sepsis has resolved, but I am concerned about any residual infection in the prostate. We will cover with Septra DS for an additional 14 days. He has phlebitis in the arm, so advised him to use moist heat. Plan to return to work full time on 04-29-11.

## 2011-05-01 NOTE — Discharge Summary (Signed)
  NAMEGARAN, FRAPPIER NO.:  [id number]  MEDICAL RECORD NO.:  [id number]  LOCATION:  1420                         FACILITY:  [facility]  PHYSICIAN:  Valetta Fuller, MD    DATE OF BIRTH:  08-24-1960  DATE OF ADMISSION:  04/10/2011 DATE OF DISCHARGE:  04/13/2011                              DISCHARGE SUMMARY   DISCHARGE DIAGNOSIS:  Acute prostatitis with urosepsis, status post transrectal ultrasound and biopsy of the prostate.  HISTORY OF PRESENT ILLNESS:  Mr. Dellarocco is 50 years of age.  The patient was seen in my office and has had a persistently elevated PSA. He has had no obvious signs and symptoms clinically of prostatitis and his urine was crystal clear.  The patient received perioperative antibiotics prior to transrectal ultrasound and biopsy of his prostate. The patient subsequently developed headache, nausea, vomiting, and at least low-grade fever.  The patient presented to the [facility] Emergency Room approximately 48 hours after he was biopsied with these complaints.  Initially, he was normotensive, but then became at least transiently hypotensive.  He was seen by Dr. Patsi Sears in my practice and thought to be developing possible urosepsis, status post prostate biopsy.  For that reason, he was admitted to the step-down unit for careful monitoring.  He initiated treatment with broad-spectrum antibiotics.  The patient was subsequently seen by Critical Care Medicine.  They discontinued the order for gentamicin and started the patient on oral Septra.  Apparently, they thought the patient was also on Fortaz at that time.  I saw the patient the following morning having learned of his admission.  The patient remained hemodynamically stable and was mostly complaining of headache at that time.  We initiated broad-spectrum coverage with Zosyn.  The patient has also had pending blood cultures and urine culture.  The patient was monitored for an additional 24  hours in the step-down unit and then transferred to the floor.  He remained hemodynamically stable and his symptoms are markedly improved over 24-36 hours.  The patient's blood cultures did show E. coli which were resistant to fluoroquinolones, but sensitive to Septra.  The patient remained afebrile with stable vital signs.  Of note, the patient's creatinine remained normal.  White blood cell count normalized as well. The patient was discharged on April 13, 2011, having been afebrile for greater than 48 hours.  He will be discharged to home.  He takes no home medications.  He will be started on Septra Double Strength b.i.d. for a 10-day course and will follow up with me next week to review his biopsy results and to make sure his clinical situation continues to improve.     Valetta Fuller, MD     DSG/MEDQ  D:  04/13/2011  T:  04/13/2011  Job:  [id number]  Electronically Signed by Barron Alvine M.D. on 05/01/2011 09:29:37 AM

## 2012-04-02 IMAGING — CT CT HEAD W/O CM
1 series · 16 of 30 positions shown, 20 images · non-contrast
Comparison: None.

CLINICAL DATA: Headache, fever

CT HEAD WITHOUT CONTRAST
TECHNIQUE: Contiguous axial images were obtained from the base of
the skull through the vertex without contrast.

[Series 2: headseq 4.8 h45s · axial · 0.45mm/px · z∈[-163,-8]mm · 16 of 36 slices shown, 20 images]
[im 2/36  brain]
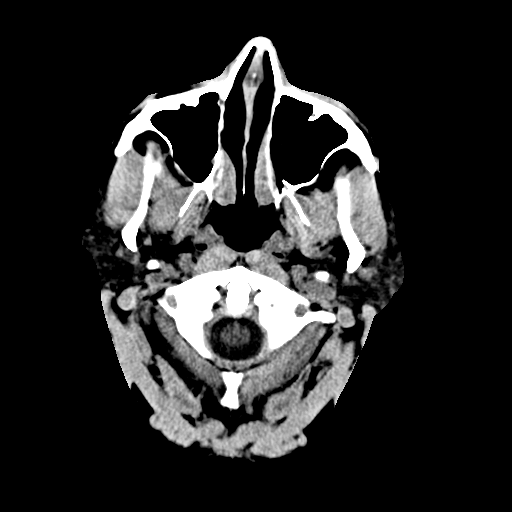
[im 2/36  bone]
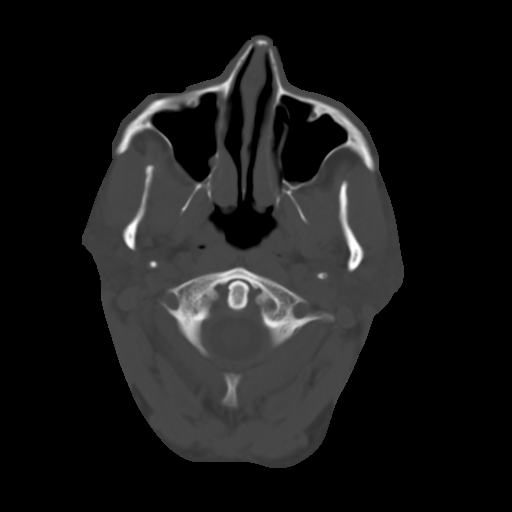
[im 4/36  brain]
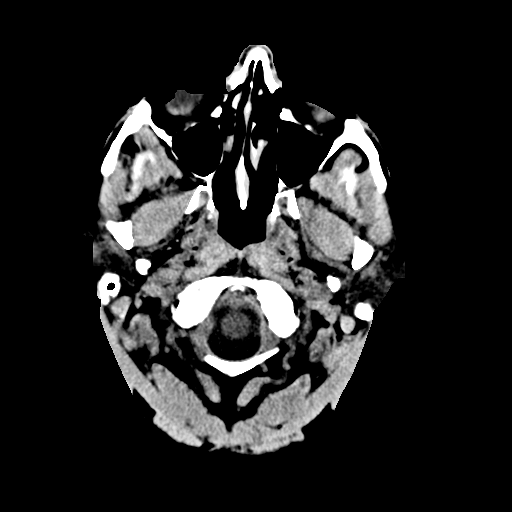
[im 7/36  brain]
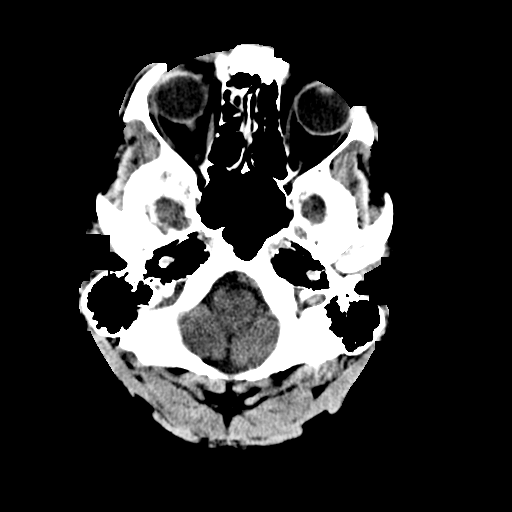
[im 9/36  brain]
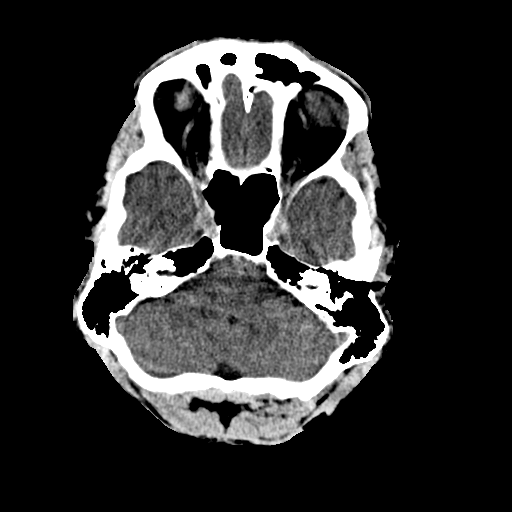
[im 10/36  brain]
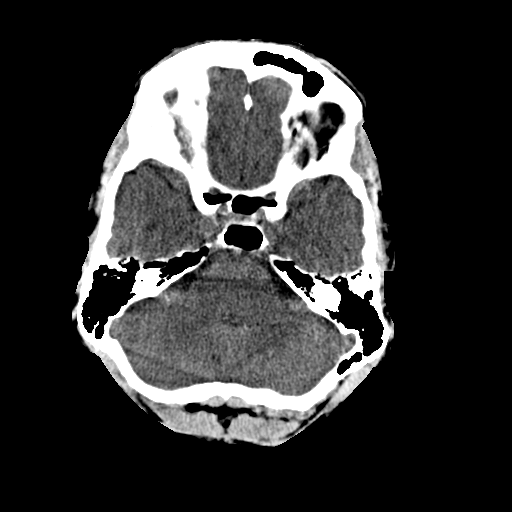
[im 10/36  bone]
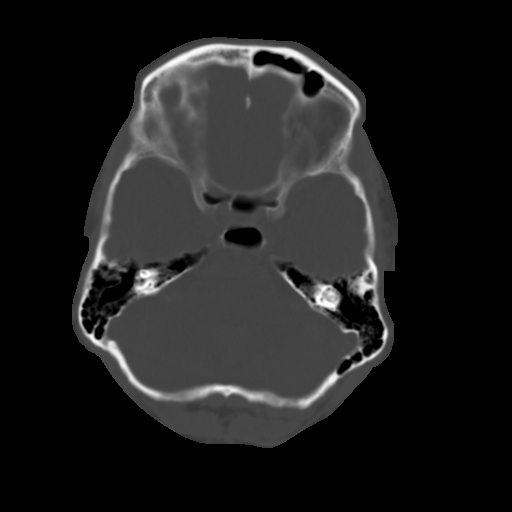
[im 13/36  brain]
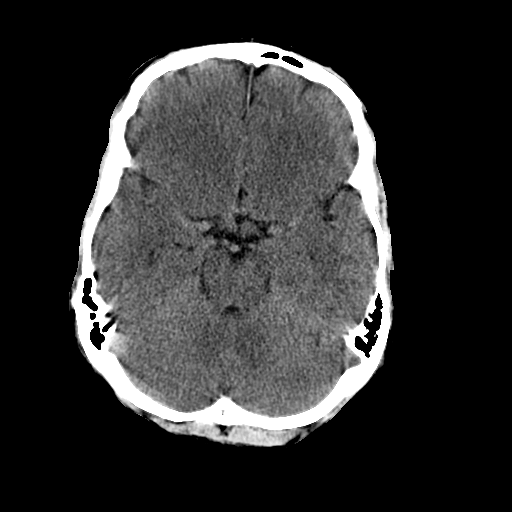
[im 15/36  brain]
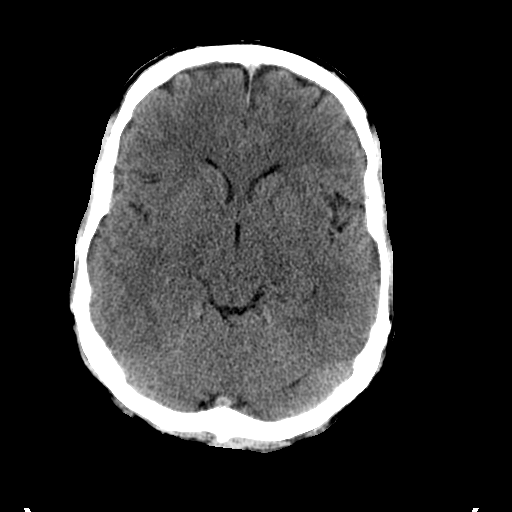
[im 17/36  brain]
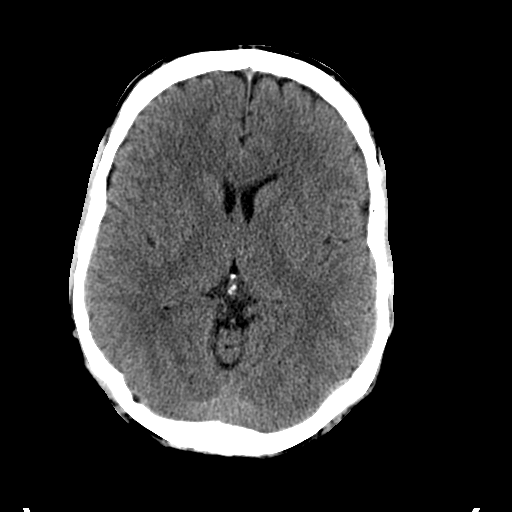
[im 19/36  brain]
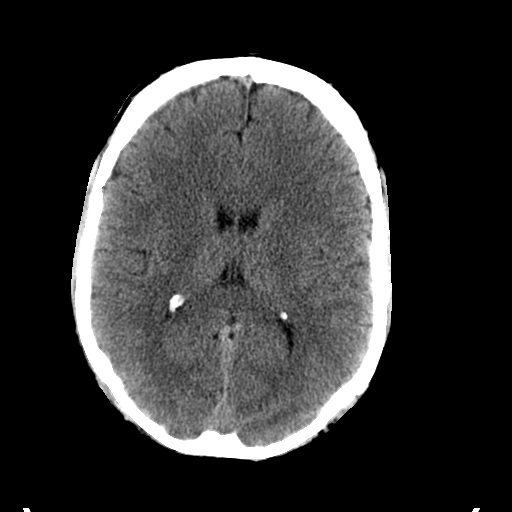
[im 19/36  bone]
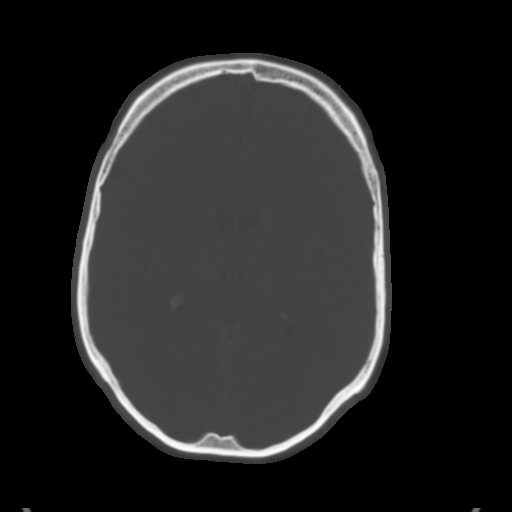
[im 21/36  brain]
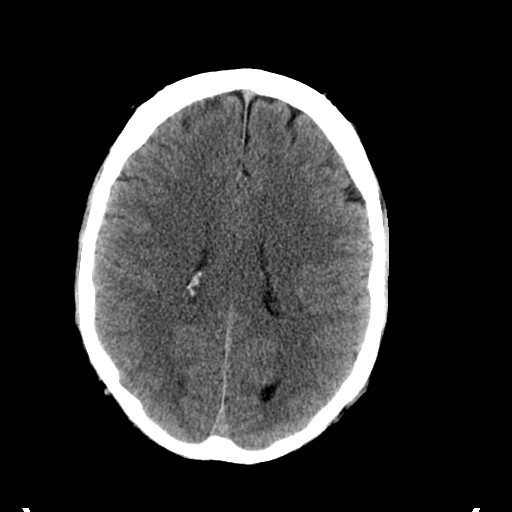
[im 23/36  brain]
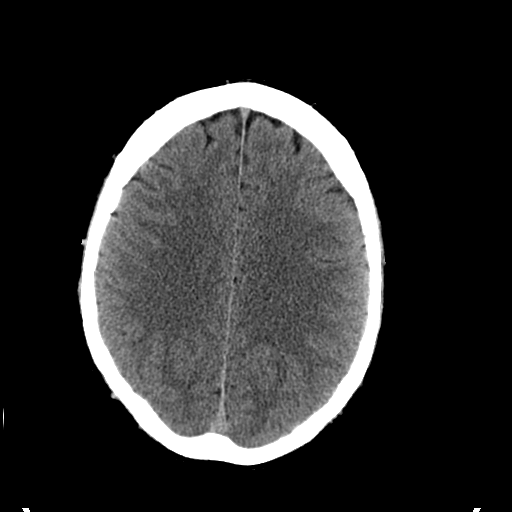
[im 26/36  brain]
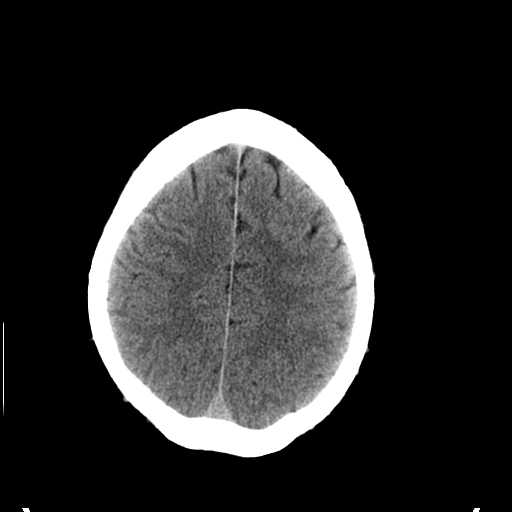
[im 27/36  brain]
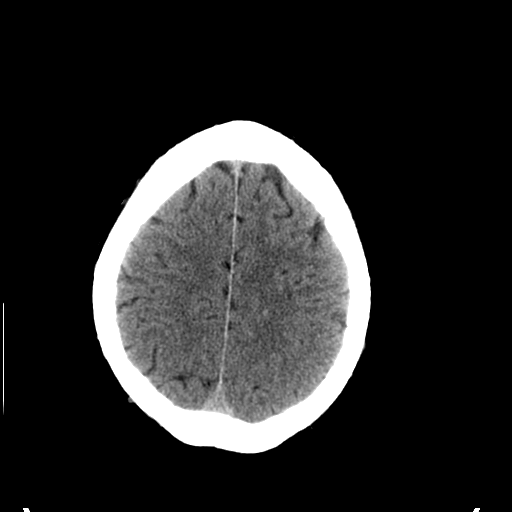
[im 27/36  bone]
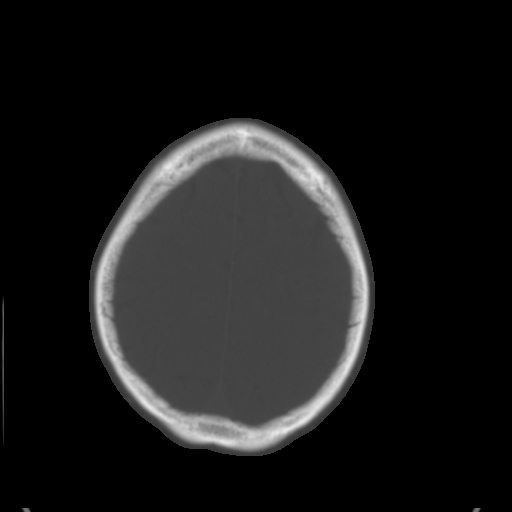
[im 29/36  brain]
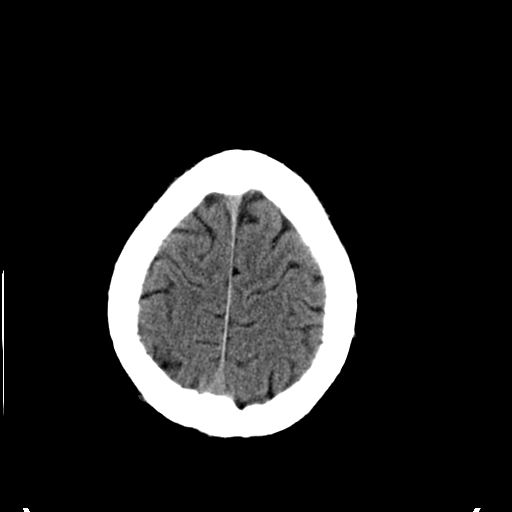
[im 32/36  brain]
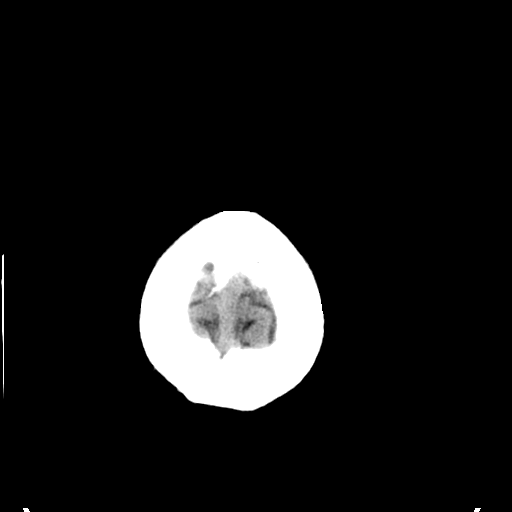
[im 34/36  brain]
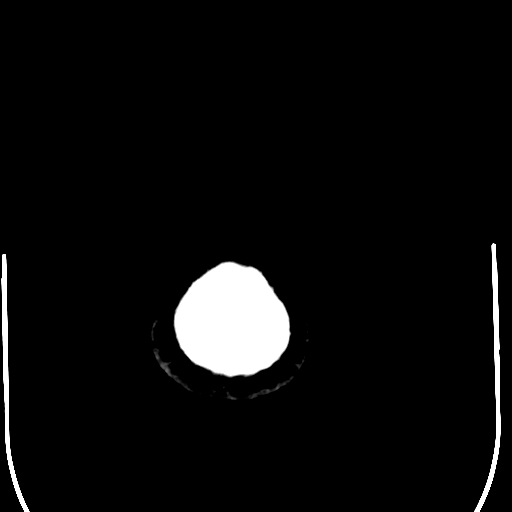

[16 of 30 positions shown; findings below may reference images not displayed]

FINDINGS: Visualized portions of paranasal sinuses and mastoid air
cells appear normally developed and well aerated. There is no
evidence of acute intracranial hemorrhage, brain edema, mass
lesion, acute infarction,   mass effect, or midline shift. Acute
infarct may be inapparent on noncontrast CT.  No other intra-axial
abnormalities are seen, and the ventricles and sulci are within
normal limits in size and symmetry.   No abnormal extra-axial fluid
collections or masses are identified.  No significant calvarial
abnormality.
IMPRESSION: 1. Negative for bleed or other acute intracranial process.

## 2013-02-04 ENCOUNTER — Encounter: Payer: Self-pay | Admitting: Internal Medicine

## 2013-03-03 ENCOUNTER — Other Ambulatory Visit (INDEPENDENT_AMBULATORY_CARE_PROVIDER_SITE_OTHER): Payer: 59

## 2013-03-03 DIAGNOSIS — Z0000 Encounter for general adult medical examination without abnormal findings: Secondary | ICD-10-CM

## 2013-03-03 LAB — POCT URINALYSIS DIPSTICK
Glucose, UA: NEGATIVE
Ketones, UA: NEGATIVE
Leukocytes, UA: NEGATIVE
Spec Grav, UA: >=1.03

## 2013-03-03 LAB — CBC WITH DIFFERENTIAL/PLATELET
Basophils Relative: 0.8 % (ref 0.0–3.0)
Eosinophils Absolute: 0.2 10*3/uL (ref 0.0–0.7)
Eosinophils Relative: 2.6 % (ref 0.0–5.0)
HCT: 43.4 % (ref 39.0–52.0)
Lymphs Abs: 2.1 10*3/uL (ref 0.7–4.0)
MCHC: 33.9 g/dL (ref 30.0–36.0)
MCV: 91.4 fl (ref 78.0–100.0)
Monocytes Absolute: 0.5 10*3/uL (ref 0.1–1.0)
Neutrophils Relative %: 52.7 % (ref 43.0–77.0)
RBC: 4.74 Mil/uL (ref 4.22–5.81)
WBC: 6 10*3/uL (ref 4.5–10.5)

## 2013-03-03 LAB — HEPATIC FUNCTION PANEL
Albumin: 4.2 g/dL (ref 3.5–5.2)
Alkaline Phosphatase: 62 U/L (ref 39–117)
Total Bilirubin: 0.7 mg/dL (ref 0.3–1.2)

## 2013-03-03 LAB — BASIC METABOLIC PANEL
BUN: 16 mg/dL (ref 6–23)
Calcium: 9.2 mg/dL (ref 8.4–10.5)
Creatinine, Ser: 1 mg/dL (ref 0.4–1.5)
GFR: 80.23 mL/min (ref >60.00)
Glucose, Bld: 79 mg/dL (ref 70–99)

## 2013-03-03 LAB — LIPID PANEL: Cholesterol: 188 mg/dL (ref 0–200)

## 2013-03-03 LAB — TSH: TSH: 3.02 u[IU]/mL (ref 0.35–5.50)

## 2013-03-05 NOTE — Progress Notes (Signed)
Quick Note:  Pt has appointment on 03/10/13 will go over then. ______

## 2013-03-10 ENCOUNTER — Ambulatory Visit (INDEPENDENT_AMBULATORY_CARE_PROVIDER_SITE_OTHER): Payer: 59 | Admitting: Family Medicine

## 2013-03-10 ENCOUNTER — Encounter: Payer: Self-pay | Admitting: Family Medicine

## 2013-03-10 VITALS — BP 130/86 | HR 83 | Temp 98.2°F | Ht 68.25 in | Wt 182.0 lb

## 2013-03-10 DIAGNOSIS — Z0000 Encounter for general adult medical examination without abnormal findings: Secondary | ICD-10-CM

## 2013-03-10 MED ORDER — DICLOFENAC SODIUM 75 MG PO TBEC: 75.0000 mg | DELAYED_RELEASE_TABLET | Freq: Two times a day (BID) | ORAL | Status: DC

## 2013-03-10 NOTE — Progress Notes (Signed)
  Subjective:    Patient ID: Brandon Cochran, male    DOB: [birth year], 52 y.o.   MRN: [id number]  HPI 52 yr old male for a cpx. He feels well except for several months of stiffness and pain in the knees, particularly when he is squatting for periods of time. No swelling or warmth. Aleve helps a little. He is seeing Dr. Isabel Caprice every 6 months to follow elevated PSA's. He is scheduled for a colonoscopy on 8-36-14.    Review of Systems  Constitutional: Negative.   HENT: Negative.   Eyes: Negative.   Respiratory: Negative.   Cardiovascular: Negative.   Gastrointestinal: Negative.   Genitourinary: Negative.   Musculoskeletal: Negative.   Skin: Negative.   Neurological: Negative.   Psychiatric/Behavioral: Negative.        Objective:   Physical Exam  Constitutional: He is oriented to person, place, and time. He appears well-developed and well-nourished. No distress.  HENT:  Head: Normocephalic and atraumatic.  Right Ear: External ear normal.  Left Ear: External ear normal.  Nose: Nose normal.  Mouth/Throat: Oropharynx is clear and moist. No oropharyngeal exudate.  Eyes: Conjunctivae and EOM are normal. Pupils are equal, round, and reactive to light. Right eye exhibits no discharge. Left eye exhibits no discharge. No scleral icterus.  Neck: Neck supple. No JVD present. No tracheal deviation present. No thyromegaly present.  Cardiovascular: Normal rate, regular rhythm, normal heart sounds and intact distal pulses.  Exam reveals no gallop and no friction rub.   No murmur heard. EKG normal   Pulmonary/Chest: Effort normal and breath sounds normal. No respiratory distress. He has no wheezes. He has no rales. He exhibits no tenderness.  Abdominal: Soft. Bowel sounds are normal. He exhibits no distension and no mass. There is no tenderness. There is no rebound and no guarding.  Musculoskeletal: Normal range of motion. He exhibits no edema and no tenderness.  Lymphadenopathy:    He has no  cervical adenopathy.  Neurological: He is alert and oriented to person, place, and time. He has normal reflexes. No cranial nerve deficit. He exhibits normal muscle tone. Coordination normal.  Skin: Skin is warm and dry. No rash noted. He is not diaphoretic. No erythema. No pallor.  Psychiatric: He has a normal mood and affect. His behavior is normal. Judgment and thought content normal.          Assessment & Plan:  Well exam. 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 Try Diclofenac for a month and recheck prn

## 2013-03-23 ENCOUNTER — Ambulatory Visit (AMBULATORY_SURGERY_CENTER): Payer: 59

## 2013-03-23 VITALS — Ht 69.0 in | Wt 183.8 lb

## 2013-03-23 DIAGNOSIS — Z1211 Encounter for screening for malignant neoplasm of colon: Secondary | ICD-10-CM

## 2013-03-23 MED ORDER — NA SULFATE-K SULFATE-MG SULF 17.5-3.13-1.6 GM/177ML PO SOLN: 1.0000 | Freq: Once | ORAL | Status: DC

## 2013-03-24 ENCOUNTER — Encounter: Payer: Self-pay | Admitting: Internal Medicine

## 2013-04-06 ENCOUNTER — Encounter: Payer: Self-pay | Admitting: Internal Medicine

## 2013-04-06 ENCOUNTER — Ambulatory Visit (AMBULATORY_SURGERY_CENTER): Payer: 59 | Admitting: Internal Medicine

## 2013-04-06 VITALS — BP 120/74 | HR 58 | Temp 97.8°F | Resp 19 | Ht 69.0 in | Wt 183.0 lb

## 2013-04-06 DIAGNOSIS — Z1211 Encounter for screening for malignant neoplasm of colon: Secondary | ICD-10-CM

## 2013-04-06 DIAGNOSIS — K5730 Diverticulosis of large intestine without perforation or abscess without bleeding: Secondary | ICD-10-CM

## 2013-04-06 DIAGNOSIS — D128 Benign neoplasm of rectum: Secondary | ICD-10-CM

## 2013-04-06 DIAGNOSIS — D126 Benign neoplasm of colon, unspecified: Secondary | ICD-10-CM

## 2013-04-06 HISTORY — PX: COLONOSCOPY: SHX174

## 2013-04-06 MED ORDER — SODIUM CHLORIDE 0.9 % IV SOLN: 500.0000 mL | INTRAVENOUS | Status: DC

## 2013-04-06 NOTE — Op Note (Signed)
[facility] [address] [place], [address]   COLONOSCOPY PROCEDURE REPORT  PATIENT: Brandon Cochran, Brandon Cochran  MR#: [id number] BIRTHDATE: 12-08-1960 , 52  yrs. old GENDER: Male ENDOSCOPIST: Iva Boop, MD, [facility] REFERRED WU:[id number] Marguerita Beards, M.D. PROCEDURE DATE:  04/06/2013 PROCEDURE:   Colonoscopy with snare polypectomy First Screening Colonoscopy - Avg.  risk and is 50 yrs.  old or older Yes.  Prior Negative Screening - Now for repeat screening. N/A  History of Adenoma - Now for follow-up colonoscopy & has been > or = to 3 yrs.  N/A  Polyps Removed Today? Yes. ASA CLASS:   Class II INDICATIONS:average risk screening and first colonoscopy. MEDICATIONS: propofol (Diprivan) 250mg  IV, These medications were titrated to patient response per physician's verbal order, and MAC sedation, administered by CRNA  DESCRIPTION OF PROCEDURE:   After the risks benefits and alternatives of the procedure were thoroughly explained, informed consent was obtained.  A digital rectal exam revealed no rectal mass and A digital rectal exam revealed no prostatic nodules.   The LB [id number]  endoscope was introduced through the anus and advanced to the cecum, which was identified by both the appendix and ileocecal valve. No adverse events experienced.   The quality of the prep was excellent using Suprep  The instrument was then slowly withdrawn as the colon was fully examined.   COLON FINDINGS: A diminutive sessile polyp was found in the rectum. A polypectomy was performed with a cold snare.  The resection was complete and the polyp tissue was completely retrieved.   Mild diverticulosis was noted in the sigmoid colon.   The colon mucosa was otherwise normal.  Retroflexed views revealed no abnormalities. The time to cecum=1 minutes 40 seconds.  Withdrawal time=10 minutes 00 seconds.  The scope was withdrawn and the procedure completed. COMPLICATIONS: There were no  complications.  ENDOSCOPIC IMPRESSION: 1.   Diminutive sessile polyp was found in the rectum; polypectomy was performed with a cold snare 2.   Mild diverticulosis was noted in the sigmoid colon 3.   The colon mucosa was otherwise normal - excellent prep - first colonoscopy  RECOMMENDATIONS: Timing of repeat colonoscopy will be determined by pathology findings.   eSigned:  Iva Boop, MD, [facility] 04/06/2013 11:07 AM   cc: Nelwyn Salisbury, MD and The Patient

## 2013-04-06 NOTE — Progress Notes (Signed)
Called to room to assist during endoscopic procedure.  Patient ID and intended procedure confirmed with present staff. Received instructions for my participation in the procedure from the performing physician.  

## 2013-04-06 NOTE — Progress Notes (Signed)
Lidocaine-40mg IV prior to Propofol InductionPropofol given over incremental dosages 

## 2013-04-06 NOTE — Patient Instructions (Addendum)
I found and removed one very small polyp. It looks benign.  I also saw mild changes of diverticulosis - not usually a problem.   I will let you know pathology results and when to have another routine colonoscopy by mail.  I appreciate the opportunity to care for you. Iva Boop, MD, FACG YOU HAD AN ENDOSCOPIC PROCEDURE TODAY AT THE [facility]: Refer to the procedure report that was given to you for any specific questions about what was found during the examination.  If the procedure report does not answer your questions, please call your gastroenterologist to clarify.  If you requested that your care partner not be given the details of your procedure findings, then the procedure report has been included in a sealed envelope for you to review at your convenience later.  YOU SHOULD EXPECT: Some feelings of bloating in the abdomen. Passage of more gas than usual.  Walking can help get rid of the air that was put into your GI tract during the procedure and reduce the bloating. If you had a lower endoscopy (such as a colonoscopy or flexible sigmoidoscopy) you may notice spotting of blood in your stool or on the toilet paper. If you underwent a bowel prep for your procedure, then you may not have a normal bowel movement for a few days.  DIET: Your first meal following the procedure should be a light meal and then it is ok to progress to your normal diet.  A half-sandwich or bowl of soup is an example of a good first meal.  Heavy or fried foods are harder to digest and may make you feel nauseous or bloated.  Likewise meals heavy in dairy and vegetables can cause extra gas to form and this can also increase the bloating.  Drink plenty of fluids but you should avoid alcoholic beverages for 24 hours.  ACTIVITY: Your care partner should take you home directly after the procedure.  You should plan to take it easy, moving slowly for the rest of the day.  You can resume normal activity the day  after the procedure however you should NOT DRIVE or use heavy machinery for 24 hours (because of the sedation medicines used during the test).    SYMPTOMS TO REPORT IMMEDIATELY: A gastroenterologist can be reached at any hour.  During normal business hours, 8:30 AM to 5:00 PM Monday through Friday, call [phone].  After hours and on weekends, please call the GI answering service at [phone] who will take a message and have the physician on call contact you.   Following lower endoscopy (colonoscopy or flexible sigmoidoscopy):  Excessive amounts of blood in the stool  Significant tenderness or worsening of abdominal pains  Swelling of the abdomen that is new, acute  Fever of 100F or higher   FOLLOW UP: If any biopsies were taken you will be contacted by phone or by letter within the next 1-3 weeks.  Call your gastroenterologist if you have not heard about the biopsies in 3 weeks.  Our staff will call the home number listed on your records the next business day following your procedure to check on you and address any questions or concerns that you may have at that time regarding the information given to you following your procedure. This is a courtesy call and so if there is no answer at the home number and we have not heard from you through the emergency physician on call, we will assume that you have returned  to your regular daily activities without incident.  SIGNATURES/CONFIDENTIALITY:    Diverticulosis and polyp information given   You and/or your care partner have signed paperwork which will be entered into your electronic medical record.  These signatures attest to the fact that that the information above on your After Visit Summary has been reviewed and is understood.  Full responsibility of the confidentiality of this discharge information lies with you and/or your care-partner.

## 2013-04-06 NOTE — Progress Notes (Signed)
Patient did not experience any of the following events: a burn prior to discharge; a fall within the facility; wrong site/side/patient/procedure/implant event; or a hospital transfer or hospital admission upon discharge from the facility. (G8907) Patient did not have preoperative order for IV antibiotic SSI prophylaxis. (G8918)  

## 2013-04-07 ENCOUNTER — Telehealth: Payer: Self-pay | Admitting: *Deleted

## 2013-04-07 NOTE — Telephone Encounter (Signed)
  Follow up Call-  Call back number 04/06/2013  Post procedure Call Back phone  # [phone]  Permission to leave phone message Yes     Patient questions:  Do you have a fever, pain , or abdominal swelling? no Pain Score  0 *  Have you tolerated food without any problems? yes  Have you been able to return to your normal activities? yes  Do you have any questions about your discharge instructions: Diet   no Medications  no Follow up visit  no  Do you have questions or concerns about your Care? no  Actions: * If pain score is 4 or above: No action needed, pain <4.

## 2013-04-14 ENCOUNTER — Encounter: Payer: Self-pay | Admitting: Internal Medicine

## 2013-04-14 DIAGNOSIS — Z860100 Personal history of colon polyps, unspecified: Secondary | ICD-10-CM | POA: Insufficient documentation

## 2013-04-14 HISTORY — DX: Personal history of colon polyps, unspecified: Z86.0100

## 2013-04-14 HISTORY — DX: Personal history of colonic polyps: Z86.010

## 2013-04-14 NOTE — Progress Notes (Signed)
Quick Note:  Diminutive adenoma Repeat colonoscopy 5 years about 03/2018 ______

## 2014-03-17 ENCOUNTER — Encounter: Payer: Self-pay | Admitting: Family Medicine

## 2014-03-17 ENCOUNTER — Ambulatory Visit (INDEPENDENT_AMBULATORY_CARE_PROVIDER_SITE_OTHER): Payer: 59 | Admitting: Family Medicine

## 2014-03-17 VITALS — BP 117/66 | HR 60 | Temp 98.8°F | Ht 69.0 in | Wt 180.0 lb

## 2014-03-17 DIAGNOSIS — J019 Acute sinusitis, unspecified: Secondary | ICD-10-CM

## 2014-03-17 MED ORDER — METHYLPREDNISOLONE ACETATE 40 MG/ML IJ SUSP
120.0000 mg | Freq: Once | INTRAMUSCULAR | Status: AC
  Administered 2014-03-17: 120 mg via INTRAMUSCULAR

## 2014-03-17 MED ORDER — AMOXICILLIN-POT CLAVULANATE 875-125 MG PO TABS: 1.0000 | ORAL_TABLET | Freq: Two times a day (BID) | ORAL | Status: DC

## 2014-03-17 NOTE — Progress Notes (Signed)
Pre visit review using our clinic review tool, if applicable. No additional management support is needed unless otherwise documented below in the visit note. 

## 2014-03-18 ENCOUNTER — Encounter: Payer: Self-pay | Admitting: Family Medicine

## 2014-03-18 NOTE — Progress Notes (Signed)
   Subjective:    Patient ID: Brandon Cochran, male    DOB: 1961/02/13, 53 y.o.   MRN: [id number]  HPI Here for one week of sinus pressure, HA. PND, and a dry cough. No fever.    Review of Systems  Constitutional: Negative.   HENT: Positive for congestion, postnasal drip and sinus pressure.   Eyes: Negative.   Respiratory: Positive for cough.        Objective:   Physical Exam  Constitutional: He appears well-developed and well-nourished.  HENT:  Right Ear: External ear normal.  Left Ear: External ear normal.  Nose: Nose normal.  Mouth/Throat: Oropharynx is clear and moist.  Eyes: Conjunctivae are normal.  Pulmonary/Chest: Effort normal and breath sounds normal.  Lymphadenopathy:    He has no cervical adenopathy.          Assessment & Plan:  Add Mucinex .

## 2014-07-22 ENCOUNTER — Encounter: Payer: Self-pay | Admitting: Family Medicine

## 2014-08-01 ENCOUNTER — Ambulatory Visit (HOSPITAL_BASED_OUTPATIENT_CLINIC_OR_DEPARTMENT_OTHER): Payer: 59 | Attending: Otolaryngology | Admitting: *Deleted

## 2014-08-01 DIAGNOSIS — Z6826 Body mass index (BMI) 26.0-26.9, adult: Secondary | ICD-10-CM | POA: Diagnosis not present

## 2014-08-01 DIAGNOSIS — G4710 Hypersomnia, unspecified: Secondary | ICD-10-CM | POA: Diagnosis present

## 2014-08-01 DIAGNOSIS — G4730 Sleep apnea, unspecified: Secondary | ICD-10-CM | POA: Diagnosis not present

## 2014-08-07 DIAGNOSIS — G4730 Sleep apnea, unspecified: Secondary | ICD-10-CM

## 2014-08-07 DIAGNOSIS — G4710 Hypersomnia, unspecified: Secondary | ICD-10-CM

## 2014-08-07 NOTE — Sleep Study (Signed)
    NAME: Brandon Cochran DATE OF BIRTH:  May 29, 1961 MEDICAL RECORD NUMBER [id number]  LOCATION: [facility]  PHYSICIAN: YOUNG,CLINTON D  DATE OF STUDY: 08/01/2014  SLEEP STUDY TYPE: Out of Center Sleep Test                REFERRING PHYSICIAN: Jodi Marble, MD  INDICATION FOR STUDY: Hypersomnia with sleep apnea  EPWORTH SLEEPINESS SCORE:   4/24 HEIGHT:   5 feet 9 inches WEIGHT:   176 pounds  BMI 26.0 NECK SIZE:   in.  MEDICATIONS: Charted for review  IMPRESSION:  Mild obstructive sleep apnea/hypopnea syndrome, AHI 10.4 per hour. 49 total events scored including 5 central apneas, 9 obstructive apneas, 35 hypopneas. Sleep position not indicated. Oxygen desaturation to a nadir of 88% with average oxygen saturation 94% on room air. Mean heart rate 55.4/m.    RECOMMENDATION:  Scores in this range might respond sufficiently to conservative measures such as treatment of nasal congestion and encouragement to sleep off flat of back. Otherwise, on an individual basis, CPAP or an oral appliance might be considered.   Deneise Lever Diplomate, American Board of Sleep Medicine  ELECTRONICALLY SIGNED ON:  08/07/2014, 9:14 AM [place] PH: [phone]   FX: [phone] [place]

## 2015-05-16 ENCOUNTER — Other Ambulatory Visit (INDEPENDENT_AMBULATORY_CARE_PROVIDER_SITE_OTHER): Payer: 59

## 2015-05-16 DIAGNOSIS — Z0000 Encounter for general adult medical examination without abnormal findings: Secondary | ICD-10-CM | POA: Diagnosis not present

## 2015-05-16 LAB — CBC WITH DIFFERENTIAL/PLATELET
BASOS PCT: 0.7 % (ref 0.0–3.0)
Basophils Absolute: 0 10*3/uL (ref 0.0–0.1)
EOS PCT: 2.1 % (ref 0.0–5.0)
Eosinophils Absolute: 0.1 10*3/uL (ref 0.0–0.7)
HCT: 43.4 % (ref 39.0–52.0)
Hemoglobin: 14.9 g/dL (ref 13.0–17.0)
LYMPHS ABS: 2 10*3/uL (ref 0.7–4.0)
Lymphocytes Relative: 39 % (ref 12.0–46.0)
MCHC: 34.4 g/dL (ref 30.0–36.0)
MCV: 88.8 fl (ref 78.0–100.0)
MONO ABS: 0.4 10*3/uL (ref 0.1–1.0)
Monocytes Relative: 8.3 % (ref 3.0–12.0)
NEUTROS ABS: 2.6 10*3/uL (ref 1.4–7.7)
NEUTROS PCT: 49.9 % (ref 43.0–77.0)
Platelets: 266 10*3/uL (ref 150.0–400.0)
RBC: 4.89 Mil/uL (ref 4.22–5.81)
RDW: 13.4 % (ref 11.5–15.5)
WBC: 5.2 10*3/uL (ref 4.0–10.5)

## 2015-05-16 LAB — HEPATIC FUNCTION PANEL
ALT: 17 U/L (ref 0–53)
AST: 18 U/L (ref 0–37)
Albumin: 4.4 g/dL (ref 3.5–5.2)
Alkaline Phosphatase: 66 U/L (ref 39–117)
BILIRUBIN TOTAL: 0.7 mg/dL (ref 0.2–1.2)
Bilirubin, Direct: 0.1 mg/dL (ref 0.0–0.3)
Total Protein: 6.7 g/dL (ref 6.0–8.3)

## 2015-05-16 LAB — LIPID PANEL
CHOLESTEROL: 193 mg/dL (ref 0–200)
HDL: 42.3 mg/dL (ref >39.00)
LDL CALC: 125 mg/dL — AB (ref 0–99)
NonHDL: 150.39
TRIGLYCERIDES: 125 mg/dL (ref 0.0–149.0)
Total CHOL/HDL Ratio: 5
VLDL: 25 mg/dL (ref 0.0–40.0)

## 2015-05-16 LAB — BASIC METABOLIC PANEL
BUN: 12 mg/dL (ref 6–23)
CHLORIDE: 106 meq/L (ref 96–112)
CO2: 27 mEq/L (ref 19–32)
Calcium: 9.5 mg/dL (ref 8.4–10.5)
Creatinine, Ser: 1.09 mg/dL (ref 0.40–1.50)
GFR: 74.54 mL/min (ref >60.00)
GLUCOSE: 94 mg/dL (ref 70–99)
POTASSIUM: 4.5 meq/L (ref 3.5–5.1)
Sodium: 143 mEq/L (ref 135–145)

## 2015-05-16 LAB — PSA: PSA: 2.98 ng/mL (ref 0.10–4.00)

## 2015-05-16 LAB — TSH: TSH: 1.9 u[IU]/mL (ref 0.35–4.50)

## 2015-05-22 ENCOUNTER — Ambulatory Visit (INDEPENDENT_AMBULATORY_CARE_PROVIDER_SITE_OTHER): Payer: 59 | Admitting: Family Medicine

## 2015-05-22 ENCOUNTER — Encounter: Payer: Self-pay | Admitting: Family Medicine

## 2015-05-22 VITALS — BP 125/84 | HR 62 | Temp 98.4°F | Ht 69.0 in | Wt 175.0 lb

## 2015-05-22 DIAGNOSIS — Z0000 Encounter for general adult medical examination without abnormal findings: Secondary | ICD-10-CM

## 2015-05-22 DIAGNOSIS — R51 Headache: Secondary | ICD-10-CM | POA: Diagnosis not present

## 2015-05-22 LAB — POCT URINALYSIS DIPSTICK

## 2015-05-22 MED ORDER — INDOMETHACIN ER 75 MG PO CPCR: 75.0000 mg | ORAL_CAPSULE | Freq: Two times a day (BID) | ORAL | Status: DC | PRN

## 2015-05-22 NOTE — Progress Notes (Signed)
Pre visit review using our clinic review tool, if applicable. No additional management support is needed unless otherwise documented below in the visit note. 

## 2015-05-23 ENCOUNTER — Encounter: Payer: Self-pay | Admitting: Family Medicine

## 2015-05-23 NOTE — Progress Notes (Signed)
   Subjective:    Patient ID: Brandon Cochran, male    DOB: 12/14/1960, 54 y.o.   MRN: [id number]  HPI 54 yr old male for a cpx. He feels well in general but he does complain of frequent  Headaches. He thinks these are related to sinus pressure. They usually involve the forehead and the cheek areas. They occur about 1-2 times a week. No blurred vision or nausea or light sensitivity with them. They often respond to Ibuprofen. He has retired but stays busy with work projects around his home.    Review of Systems  Constitutional: Negative.   Eyes: Negative.   Respiratory: Negative.   Cardiovascular: Negative.   Gastrointestinal: Negative.   Genitourinary: Negative.   Musculoskeletal: Negative.   Skin: Negative.   Neurological: Positive for headaches. Negative for dizziness, tremors, seizures, syncope, facial asymmetry, speech difficulty, weakness, light-headedness and numbness.  Psychiatric/Behavioral: Negative.        Objective:   Physical Exam  Constitutional: He is oriented to person, place, and time. He appears well-developed and well-nourished. No distress.  HENT:  Head: Normocephalic and atraumatic.  Right Ear: External ear normal.  Left Ear: External ear normal.  Nose: Nose normal.  Mouth/Throat: Oropharynx is clear and moist. No oropharyngeal exudate.  Eyes: Conjunctivae and EOM are normal. Pupils are equal, round, and reactive to light. Right eye exhibits no discharge. Left eye exhibits no discharge. No scleral icterus.  Neck: Neck supple. No JVD present. No tracheal deviation present. No thyromegaly present.  Cardiovascular: Normal rate, regular rhythm, normal heart sounds and intact distal pulses.  Exam reveals no gallop and no friction rub.   No murmur heard. EKG normal   Pulmonary/Chest: Effort normal and breath sounds normal. No respiratory distress. He has no wheezes. He has no rales. He exhibits no tenderness.  Abdominal: Soft. Bowel sounds are normal. He exhibits no  distension and no mass. There is no tenderness. There is no rebound and no guarding.  Genitourinary: Rectum normal, prostate normal and penis normal. Guaiac negative stool. No penile tenderness.  Musculoskeletal: Normal range of motion. He exhibits no edema or tenderness.  Lymphadenopathy:    He has no cervical adenopathy.  Neurological: He is alert and oriented to person, place, and time. He has normal reflexes. No cranial nerve deficit. He exhibits normal muscle tone. Coordination normal.  Skin: Skin is warm and dry. No rash noted. He is not diaphoretic. No erythema. No pallor.  Psychiatric: He has a normal mood and affect. His behavior is normal. Judgment and thought content normal.          Assessment & Plan:  Well exam. We discussed diet and exercise advice. He can try Indocin 75 mg prn for the headaches.

## 2015-09-05 ENCOUNTER — Other Ambulatory Visit: Payer: Self-pay | Admitting: Dermatology

## 2016-05-14 ENCOUNTER — Other Ambulatory Visit (INDEPENDENT_AMBULATORY_CARE_PROVIDER_SITE_OTHER): Payer: 59

## 2016-05-14 DIAGNOSIS — Z0000 Encounter for general adult medical examination without abnormal findings: Secondary | ICD-10-CM | POA: Diagnosis not present

## 2016-05-14 LAB — CBC WITH DIFFERENTIAL/PLATELET
BASOS ABS: 0 10*3/uL (ref 0.0–0.1)
BASOS PCT: 0.8 % (ref 0.0–3.0)
EOS ABS: 0.1 10*3/uL (ref 0.0–0.7)
Eosinophils Relative: 1.8 % (ref 0.0–5.0)
HEMATOCRIT: 45.2 % (ref 39.0–52.0)
HEMOGLOBIN: 15.7 g/dL (ref 13.0–17.0)
LYMPHS PCT: 34.3 % (ref 12.0–46.0)
Lymphs Abs: 2.1 10*3/uL (ref 0.7–4.0)
MCHC: 34.7 g/dL (ref 30.0–36.0)
MCV: 87.6 fl (ref 78.0–100.0)
Monocytes Absolute: 0.6 10*3/uL (ref 0.1–1.0)
Monocytes Relative: 9.6 % (ref 3.0–12.0)
Neutro Abs: 3.3 10*3/uL (ref 1.4–7.7)
Neutrophils Relative %: 53.5 % (ref 43.0–77.0)
Platelets: 233 10*3/uL (ref 150.0–400.0)
RBC: 5.16 Mil/uL (ref 4.22–5.81)
RDW: 13.4 % (ref 11.5–15.5)
WBC: 6.2 10*3/uL (ref 4.0–10.5)

## 2016-05-14 LAB — POC URINALSYSI DIPSTICK (AUTOMATED)
BILIRUBIN UA: NEGATIVE
GLUCOSE UA: NEGATIVE
Ketones, UA: NEGATIVE
Leukocytes, UA: NEGATIVE
NITRITE UA: NEGATIVE
Protein, UA: NEGATIVE
RBC UA: NEGATIVE
SPEC GRAV UA: 1.015
Urobilinogen, UA: 0.2
pH, UA: 7

## 2016-05-14 LAB — HEPATIC FUNCTION PANEL
ALK PHOS: 66 U/L (ref 39–117)
ALT: 27 U/L (ref 0–53)
AST: 21 U/L (ref 0–37)
Albumin: 4.4 g/dL (ref 3.5–5.2)
BILIRUBIN TOTAL: 1 mg/dL (ref 0.2–1.2)
Bilirubin, Direct: 0.2 mg/dL (ref 0.0–0.3)
Total Protein: 7 g/dL (ref 6.0–8.3)

## 2016-05-14 LAB — BASIC METABOLIC PANEL
BUN: 9 mg/dL (ref 6–23)
CHLORIDE: 104 meq/L (ref 96–112)
CO2: 30 meq/L (ref 19–32)
Calcium: 9 mg/dL (ref 8.4–10.5)
Creatinine, Ser: 1.12 mg/dL (ref 0.40–1.50)
GFR: 71.98 mL/min (ref >60.00)
GLUCOSE: 77 mg/dL (ref 70–99)
POTASSIUM: 4 meq/L (ref 3.5–5.1)
SODIUM: 141 meq/L (ref 135–145)

## 2016-05-14 LAB — PSA: PSA: 2.54 ng/mL (ref 0.10–4.00)

## 2016-05-14 LAB — LIPID PANEL
Cholesterol: 213 mg/dL — ABNORMAL HIGH (ref 0–200)
HDL: 44.9 mg/dL (ref >39.00)
LDL CALC: 139 mg/dL — AB (ref 0–99)
NONHDL: 168.33
Total CHOL/HDL Ratio: 5
Triglycerides: 146 mg/dL (ref 0.0–149.0)
VLDL: 29.2 mg/dL (ref 0.0–40.0)

## 2016-05-14 LAB — TSH: TSH: 2.01 u[IU]/mL (ref 0.35–4.50)

## 2016-05-21 ENCOUNTER — Ambulatory Visit (INDEPENDENT_AMBULATORY_CARE_PROVIDER_SITE_OTHER): Payer: 59 | Admitting: Family Medicine

## 2016-05-21 ENCOUNTER — Encounter: Payer: Self-pay | Admitting: Family Medicine

## 2016-05-21 VITALS — BP 120/87 | HR 62 | Temp 98.1°F | Ht 69.0 in | Wt 182.0 lb

## 2016-05-21 DIAGNOSIS — K645 Perianal venous thrombosis: Secondary | ICD-10-CM | POA: Diagnosis not present

## 2016-05-21 DIAGNOSIS — Z0000 Encounter for general adult medical examination without abnormal findings: Secondary | ICD-10-CM

## 2016-05-21 DIAGNOSIS — Z0001 Encounter for general adult medical examination with abnormal findings: Secondary | ICD-10-CM | POA: Diagnosis not present

## 2016-05-21 MED ORDER — FLUTICASONE PROPIONATE 50 MCG/ACT NA SUSP
2.0000 | Freq: Every day | NASAL | 0 refills | Status: DC
Start: 2016-05-21 — End: 2020-03-16

## 2016-05-21 MED ORDER — AZELASTINE HCL 0.1 % NA SOLN: 2.0000 | Freq: Two times a day (BID) | NASAL | 0 refills | Status: DC

## 2016-05-21 NOTE — Progress Notes (Signed)
Pre visit review using our clinic review tool, if applicable. No additional management support is needed unless otherwise documented below in the visit note. 

## 2016-05-21 NOTE — Progress Notes (Signed)
   Subjective:    Patient ID: Brandon Cochran, male    DOB: [birth year], 55 y.o.   MRN: [id number]  HPI 55 yr old male for a well exam. He feels great but does mention one current issue. He has had several hemorrhoids over the years that usually go down on their own, but he has had one now for 2 weeks that persists. It is mildly painful but does not bleed.    Review of Systems  Constitutional: Negative.   HENT: Negative.   Eyes: Negative.   Respiratory: Negative.   Cardiovascular: Negative.   Gastrointestinal: Positive for rectal pain. Negative for abdominal distention, abdominal pain, anal bleeding, blood in stool, constipation, diarrhea, nausea and vomiting.  Genitourinary: Negative.   Musculoskeletal: Negative.   Skin: Negative.   Neurological: Negative.   Psychiatric/Behavioral: Negative.        Objective:   Physical Exam  Constitutional: He is oriented to person, place, and time. He appears well-developed and well-nourished. No distress.  HENT:  Head: Normocephalic and atraumatic.  Right Ear: External ear normal.  Left Ear: External ear normal.  Nose: Nose normal.  Mouth/Throat: Oropharynx is clear and moist. No oropharyngeal exudate.  Eyes: Conjunctivae and EOM are normal. Pupils are equal, round, and reactive to light. Right eye exhibits no discharge. Left eye exhibits no discharge. No scleral icterus.  Neck: Neck supple. No JVD present. No tracheal deviation present. No thyromegaly present.  Cardiovascular: Normal rate, regular rhythm, normal heart sounds and intact distal pulses.  Exam reveals no gallop and no friction rub.   No murmur heard. EKG is unremarkable   Pulmonary/Chest: Effort normal and breath sounds normal. No respiratory distress. He has no wheezes. He has no rales. He exhibits no tenderness.  Abdominal: Soft. Bowel sounds are normal. He exhibits no distension and no mass. There is no tenderness. There is no rebound and no guarding.  Genitourinary:    Genitourinary Comments: There is a single thrombosed external hemorrhoid  Musculoskeletal: Normal range of motion. He exhibits no edema or tenderness.  Lymphadenopathy:    He has no cervical adenopathy.  Neurological: He is alert and oriented to person, place, and time. He has normal reflexes. No cranial nerve deficit. He exhibits normal muscle tone. Coordination normal.  Skin: Skin is warm and dry. No rash noted. He is not diaphoretic. No erythema. No pallor.  Psychiatric: He has a normal mood and affect. His behavior is normal. Judgment and thought content normal.          Assessment & Plan:  Well exam. We discussed diet and exercise. The hemorrhoid was lanced with a scalpel and the thrombus was removed. Recheck prn.  Laurey Morale, MD

## 2016-06-25 ENCOUNTER — Other Ambulatory Visit: Payer: Self-pay | Admitting: Dermatology

## 2016-09-05 ENCOUNTER — Other Ambulatory Visit: Payer: Self-pay | Admitting: Dermatology

## 2016-09-05 DIAGNOSIS — L720 Epidermal cyst: Secondary | ICD-10-CM | POA: Diagnosis not present

## 2016-10-08 ENCOUNTER — Ambulatory Visit (INDEPENDENT_AMBULATORY_CARE_PROVIDER_SITE_OTHER): Payer: 59 | Admitting: Orthopaedic Surgery

## 2016-10-08 DIAGNOSIS — M25522 Pain in left elbow: Secondary | ICD-10-CM | POA: Diagnosis not present

## 2016-10-08 DIAGNOSIS — M25531 Pain in right wrist: Secondary | ICD-10-CM

## 2016-10-08 MED ORDER — LIDOCAINE HCL 1 % IJ SOLN
1.0000 mL | INTRAMUSCULAR | Status: AC | PRN
  Administered 2016-10-08: 1 mL

## 2016-10-08 MED ORDER — METHYLPREDNISOLONE ACETATE 40 MG/ML IJ SUSP
40.0000 mg | INTRAMUSCULAR | Status: AC | PRN
  Administered 2016-10-08: 40 mg via INTRA_ARTICULAR

## 2016-10-08 MED ORDER — METHYLPREDNISOLONE ACETATE 40 MG/ML IJ SUSP
40.0000 mg | INTRAMUSCULAR | Status: AC | PRN
  Administered 2016-10-08: 40 mg

## 2016-10-08 NOTE — Progress Notes (Signed)
Office Visit Note   Patient: Brandon Cochran           Date of Birth: [birth year]           MRN: [id number] Visit Date: 10/08/2016              Requested by: Laurey Morale, MD [place], [place] [address] PCP: Alysia Penna, MD   Assessment & Plan: Visit Diagnoses:  1. Pain in left elbow   2. Pain in right wrist     Plan: He tolerated the injection in his left elbow epicondylar area as well as the right wrist joint well. As always she'll follow-up as needed. He needs anything at anytime to let us know.  Follow-Up Instructions: Return if symptoms worsen or fail to improve.   Orders:  Orders Placed This Encounter  Procedures  . Hand/Upper Extremity Injection/Arthrocentesis  . Medium Joint Injection/Arthrocentesis   No orders of the defined types were placed in this encounter.     Procedures: Hand/UE Inj Date/Time: 10/08/2016 10:01 AM Performed by: Mcarthur Rossetti Authorized by: Jean Rosenthal Y   Condition: lateral epicondylitis   Site:  L elbow Medications:  1 mL lidocaine 1 %; 40 mg methylPREDNISolone acetate 40 MG/ML Medium Joint Inj Date/Time: 10/08/2016 10:03 AM Performed by: Mcarthur Rossetti Authorized by: Mcarthur Rossetti   Location:  Wrist Site:  R radiocarpal Approach:  Dorsal Ultrasound Guided: No   Fluoroscopic Guidance: No   Medications:  1 mL lidocaine 1 %; 40 mg methylPREDNISolone acetate 40 MG/ML     Clinical Data: No additional findings.   Subjective: No chief complaint on file. Patient is well-known to me. He has chief complaint of left elbow pain over the lateral epicondylar area and right wrist pain. We've injected both these areas before remotely it's been over a year. He comes in today with an like to have injection in both areas. 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 Both areas are painful to  him and injections of helped in the past he denies a numbness and tingling or any new injuries.  HPI  Review of Systems Isaac chest pain, shortness of breath, fever, chills, nausea, vomiting.  Objective: Vital Signs: There were no vitals taken for this visit.  Physical Exam He is alert and oriented 3 in no acute distress Ortho Exam Examination of his left elbow shows full range motion of the elbow. He has pain over the lateral epicondylar area only. The elbow is ligament was a stable. He has no pain over the medial epicondyle and no pain over the olecranon. He has negative Tinel sign of she will tunnel. Sources right wrist goes does have pain central in the wrist. He I ask full dorsiflexion by about 5 and full palmar flexion as well. The wrist definitely feels arthritic. Specialty Comments:  No specialty comments available.  Imaging: No results found.   PMFS History: Patient Active Problem List   Diagnosis Date Noted  . Personal history of colonic adenoma 04/14/2013  . BENIGN PROSTATIC HYPERTROPHY, WITH OBSTRUCTION 07/04/2008   Past Medical History:  Diagnosis Date  . Allergy    sees Dr. Mosetta Anis   . BPH with elevated PSA    sees Dr. Risa Grill  . Personal history of colonic adenoma 04/14/2013  . Urosepsis August 2012    Family History  Problem Relation  Age of Onset  . Lung cancer Father   . Colon cancer Neg Hx   . Esophageal cancer Neg Hx   . Rectal cancer Neg Hx   . Stomach cancer Neg Hx     Past Surgical History:  Procedure Laterality Date  . COLONOSCOPY  04-06-13   per Dr. Carlean Purl, adenopmatous polyps, repeat in 5 yrs   . HERNIA REPAIR  1996   right inguinal   . NASAL SEPTUM SURGERY  [id number]   per Dr. Erik Obey   . PROSTATE BIOPSY  04-08-11   per Dr. Risa Grill    Social History   Occupational History  . Not on file.   Social History Main Topics  . Smoking status: Never Smoker  . Smokeless tobacco: Never Used  . Alcohol use No  . Drug use: No  . Sexual  activity: Not on file

## 2016-10-21 DIAGNOSIS — H5213 Myopia, bilateral: Secondary | ICD-10-CM | POA: Diagnosis not present

## 2016-10-25 DIAGNOSIS — J0100 Acute maxillary sinusitis, unspecified: Secondary | ICD-10-CM | POA: Diagnosis not present

## 2016-11-13 DIAGNOSIS — J209 Acute bronchitis, unspecified: Secondary | ICD-10-CM | POA: Diagnosis not present

## 2017-03-03 DIAGNOSIS — H16223 Keratoconjunctivitis sicca, not specified as Sjogren's, bilateral: Secondary | ICD-10-CM | POA: Diagnosis not present

## 2017-03-03 DIAGNOSIS — H52211 Irregular astigmatism, right eye: Secondary | ICD-10-CM | POA: Diagnosis not present

## 2017-04-16 DIAGNOSIS — H01021 Squamous blepharitis right upper eyelid: Secondary | ICD-10-CM | POA: Diagnosis not present

## 2017-04-16 DIAGNOSIS — H16223 Keratoconjunctivitis sicca, not specified as Sjogren's, bilateral: Secondary | ICD-10-CM | POA: Diagnosis not present

## 2017-06-10 ENCOUNTER — Ambulatory Visit (INDEPENDENT_AMBULATORY_CARE_PROVIDER_SITE_OTHER): Payer: 59 | Admitting: Orthopaedic Surgery

## 2017-06-10 DIAGNOSIS — M7712 Lateral epicondylitis, left elbow: Secondary | ICD-10-CM | POA: Insufficient documentation

## 2017-06-10 DIAGNOSIS — M25531 Pain in right wrist: Secondary | ICD-10-CM | POA: Diagnosis not present

## 2017-06-10 MED ORDER — LIDOCAINE HCL 1 % IJ SOLN
1.0000 mL | INTRAMUSCULAR | Status: AC | PRN
  Administered 2017-06-10: 1 mL

## 2017-06-10 MED ORDER — METHYLPREDNISOLONE ACETATE 40 MG/ML IJ SUSP
40.0000 mg | INTRAMUSCULAR | Status: AC | PRN
Start: 2017-06-10 — End: 2017-06-10
  Administered 2017-06-10: 40 mg via INTRA_ARTICULAR

## 2017-06-10 MED ORDER — METHYLPREDNISOLONE ACETATE 40 MG/ML IJ SUSP
40.0000 mg | INTRAMUSCULAR | Status: AC | PRN
  Administered 2017-06-10: 40 mg

## 2017-06-10 NOTE — Progress Notes (Signed)
Office Visit Note   Patient: Brandon Cochran           Date of Birth: 1961/05/25           MRN: [id number] Visit Date: 06/10/2017              Requested by: Laurey Morale, MD [place], [place] [address] PCP: Laurey Morale, MD   Assessment & Plan: Visit Diagnoses:  1. Lateral epicondylitis, left elbow   2. Pain in right wrist     Plan: I agree with him trying steroid injections around the lateral epicondyle area of the left elbow in the the right wrist joint.  He understands fully the risk and benefits of these injections.  He tolerated them well.  All questions and concerns were answered and addressed.  Follow-up as needed.  Follow-Up Instructions: Return if symptoms worsen or fail to improve.   Orders:  No orders of the defined types were placed in this encounter.  No orders of the defined types were placed in this encounter.     Procedures: Hand/UE Inj Date/Time: 06/10/2017 9:28 AM Performed by: Mcarthur Rossetti Authorized by: Jean Rosenthal Y   Condition: lateral epicondylitis   Site:  L elbow Medications:  1 mL lidocaine 1 %; 40 mg methylPREDNISolone acetate 40 MG/ML Medium Joint Inj Date/Time: 06/10/2017 9:29 AM Performed by: Mcarthur Rossetti Authorized by: Mcarthur Rossetti   Location:  Wrist Site:  R radiocarpal Ultrasound Guided: No   Fluoroscopic Guidance: No   Medications:  1 mL lidocaine 1 %; 40 mg methylPREDNISolone acetate 40 MG/ML     Clinical Data: No additional findings.   Subjective: No chief complaint on file. The patient comes in today with chief complaint of left elbow lateral epicondylar pain and right wrist pain.  We have seen him for this before but is been remote.  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  He is been working on building a building on his property [place].  He is doing a lot of heavy duty activities with this and is been having increasing pain.  He denies any  numbness and tingling in his bilateral upper extremities.  He does report enough pain to consider injections today and is right wrist joint and left elbow epicondylar area.  He denies any instability symptoms of either his left or right upper extremities.  HPI  Review of Systems He currently denies any headache, chest pain, shortness of breath, fever, chills, nausea, vomiting.  Objective: Vital Signs: There were no vitals taken for this visit.  Physical Exam He is alert and oriented x3 and in no acute distress Ortho Exam Examination of his left elbow shows full range of motion of the elbow.  He has tenderness over the lateral epicondyle area.  The elbow is ligamentously stable.  Examination of his right wrist shows that he is lacking full range of motion of his right wrist with palmar flexion dorsiflexion.  There is no swelling but the wrist is globally tender. Specialty Comments:  No specialty comments available.  Imaging: No results found.   PMFS History: Patient Active Problem List   Diagnosis Date Noted  . Lateral epicondylitis, left elbow 06/10/2017  . Pain in right wrist 06/10/2017  . Personal history of colonic adenoma 04/14/2013  . BENIGN PROSTATIC HYPERTROPHY, WITH OBSTRUCTION 07/04/2008   Past Medical History:  Diagnosis Date  . Allergy    sees Dr. Mosetta Anis   .  BPH with elevated PSA    sees Dr. Risa Grill  . Personal history of colonic adenoma 04/14/2013  . Urosepsis August 2012    Family History  Problem Relation Age of Onset  . Lung cancer Father   . Colon cancer Neg Hx   . Esophageal cancer Neg Hx   . Rectal cancer Neg Hx   . Stomach cancer Neg Hx     Past Surgical History:  Procedure Laterality Date  . COLONOSCOPY  04-06-13   per Dr. Carlean Purl, adenopmatous polyps, repeat in 5 yrs   . HERNIA REPAIR  1996   right inguinal   . NASAL SEPTUM SURGERY  1027   per Dr. Erik Obey   . PROSTATE BIOPSY  04-08-11   per Dr. Risa Grill    Social History   Occupational  History  . Not on file.   Social History Main Topics  . Smoking status: Never Smoker  . Smokeless tobacco: Never Used  . Alcohol use No  . Drug use: No  . Sexual activity: Not on file

## 2017-06-12 ENCOUNTER — Ambulatory Visit (INDEPENDENT_AMBULATORY_CARE_PROVIDER_SITE_OTHER): Payer: 59 | Admitting: Orthopaedic Surgery

## 2017-07-26 DIAGNOSIS — J0100 Acute maxillary sinusitis, unspecified: Secondary | ICD-10-CM | POA: Diagnosis not present

## 2017-12-11 ENCOUNTER — Ambulatory Visit (INDEPENDENT_AMBULATORY_CARE_PROVIDER_SITE_OTHER): Payer: 59 | Admitting: Physician Assistant

## 2017-12-11 ENCOUNTER — Encounter (INDEPENDENT_AMBULATORY_CARE_PROVIDER_SITE_OTHER): Payer: Self-pay | Admitting: Physician Assistant

## 2017-12-11 DIAGNOSIS — M25531 Pain in right wrist: Secondary | ICD-10-CM

## 2017-12-11 MED ORDER — LIDOCAINE HCL 1 % IJ SOLN
1.0000 mL | INTRAMUSCULAR | Status: AC | PRN
  Administered 2017-12-11: 1 mL

## 2017-12-11 MED ORDER — METHYLPREDNISOLONE ACETATE 40 MG/ML IJ SUSP
40.0000 mg | INTRAMUSCULAR | Status: AC | PRN
  Administered 2017-12-11: 40 mg

## 2017-12-11 NOTE — Progress Notes (Signed)
   Procedure Note  Patient: Brandon Cochran             Date of Birth: [birth year]           MRN: [id number]             Visit Date: 12/11/2017  HPI Brandon Cochran returns today due to right wrist pain.  Last seen in October was having mostly dorsal right wrist pain.  He is now having volar wrist pain.  States the only event in which he had pain involving the right wrist was after carrying his 20 pound dog from the beach.  He has had no other injury.  Pain does radiate up his arm.  But no numbness tingling.  He is doing a little better after icing and Advil.  He is requesting an injection in the wrist today.  States that the wrist bothers him during certain movements like reaching over to wash his other arm in the shower.  ROS: See HPI otherwise negative  UV:OZDGU wrist no rashes skin lesions ulcerations.  Has a area of maximal tenderness at the wrist joint radial aspect.  Maximal tenderness is on the volar aspect but he has some tenderness on the dorsal aspect also.  Negative Finkelstein's negative grind test.  He has full range of motion of the hand has full sensation.  Radial pulses intact.  Procedures: Visit Diagnoses: Pain in right wrist  Hand/UE Inj for (Wrist pain radial ) on 12/11/2017 5:16 PM Details: volar approach Medications: 1 mL lidocaine 1 %; 40 mg methylPREDNISolone acetate 40 MG/ML Consent was given by the patient. Patient was prepped and draped in the usual sterile fashion.     Plan: He is activities as tolerated.  He will follow-up with [place] on as-needed basis pain persist or becomes worse.  Questions encouraged and answered.

## 2018-02-26 ENCOUNTER — Encounter: Payer: Self-pay | Admitting: Family Medicine

## 2018-02-26 ENCOUNTER — Ambulatory Visit (INDEPENDENT_AMBULATORY_CARE_PROVIDER_SITE_OTHER): Payer: 59 | Admitting: Family Medicine

## 2018-02-26 VITALS — BP 118/80 | HR 70 | Temp 97.9°F | Ht 68.5 in | Wt 180.2 lb

## 2018-02-26 DIAGNOSIS — Z0000 Encounter for general adult medical examination without abnormal findings: Secondary | ICD-10-CM

## 2018-02-26 LAB — CBC WITH DIFFERENTIAL/PLATELET
BASOS ABS: 0.1 10*3/uL (ref 0.0–0.1)
Basophils Relative: 1.1 % (ref 0.0–3.0)
EOS ABS: 0.1 10*3/uL (ref 0.0–0.7)
Eosinophils Relative: 2 % (ref 0.0–5.0)
HCT: 45.1 % (ref 39.0–52.0)
Hemoglobin: 15.8 g/dL (ref 13.0–17.0)
LYMPHS ABS: 2.1 10*3/uL (ref 0.7–4.0)
Lymphocytes Relative: 38.5 % (ref 12.0–46.0)
MCHC: 34.9 g/dL (ref 30.0–36.0)
MCV: 87.8 fl (ref 78.0–100.0)
MONOS PCT: 10.1 % (ref 3.0–12.0)
Monocytes Absolute: 0.6 10*3/uL (ref 0.1–1.0)
NEUTROS PCT: 48.3 % (ref 43.0–77.0)
Neutro Abs: 2.7 10*3/uL (ref 1.4–7.7)
PLATELETS: 238 10*3/uL (ref 150.0–400.0)
RBC: 5.14 Mil/uL (ref 4.22–5.81)
RDW: 13.9 % (ref 11.5–15.5)
WBC: 5.6 10*3/uL (ref 4.0–10.5)

## 2018-02-26 LAB — HEPATIC FUNCTION PANEL
ALK PHOS: 77 U/L (ref 39–117)
ALT: 23 U/L (ref 0–53)
AST: 18 U/L (ref 0–37)
Albumin: 4.6 g/dL (ref 3.5–5.2)
BILIRUBIN DIRECT: 0.2 mg/dL (ref 0.0–0.3)
BILIRUBIN TOTAL: 1 mg/dL (ref 0.2–1.2)
Total Protein: 7 g/dL (ref 6.0–8.3)

## 2018-02-26 LAB — LIPID PANEL
Cholesterol: 204 mg/dL — ABNORMAL HIGH (ref 0–200)
HDL: 44.1 mg/dL (ref >39.00)
LDL Cholesterol: 132 mg/dL — ABNORMAL HIGH (ref 0–99)
NONHDL: 159.84
Total CHOL/HDL Ratio: 5
Triglycerides: 139 mg/dL (ref 0.0–149.0)
VLDL: 27.8 mg/dL (ref 0.0–40.0)

## 2018-02-26 LAB — POC URINALSYSI DIPSTICK (AUTOMATED)
Bilirubin, UA: NEGATIVE
Glucose, UA: NEGATIVE
KETONES UA: NEGATIVE
LEUKOCYTES UA: NEGATIVE
NITRITE UA: NEGATIVE
PH UA: 7.5 (ref 5.0–8.0)
PROTEIN UA: NEGATIVE
RBC UA: NEGATIVE
Spec Grav, UA: 1.015 (ref 1.010–1.025)
Urobilinogen, UA: 0.2 E.U./dL

## 2018-02-26 LAB — BASIC METABOLIC PANEL
BUN: 14 mg/dL (ref 6–23)
CHLORIDE: 101 meq/L (ref 96–112)
CO2: 31 mEq/L (ref 19–32)
CREATININE: 1.12 mg/dL (ref 0.40–1.50)
Calcium: 9.4 mg/dL (ref 8.4–10.5)
GFR: 71.52 mL/min (ref >60.00)
Glucose, Bld: 82 mg/dL (ref 70–99)
POTASSIUM: 4 meq/L (ref 3.5–5.1)
SODIUM: 139 meq/L (ref 135–145)

## 2018-02-26 LAB — TSH: TSH: 3.01 u[IU]/mL (ref 0.35–4.50)

## 2018-02-26 LAB — PSA: PSA: 3.17 ng/mL (ref 0.10–4.00)

## 2018-02-26 NOTE — Progress Notes (Signed)
   Subjective:    Patient ID: Brandon Cochran, male    DOB: May 05, 1961, 57 y.o.   MRN: [id number]  HPI Here for a well exam. He feels well in general. 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 He ad been seeing Dr. Risa Grill for elevated PSA levels, but last year it was back to normal so he has turned it back over to use to check this yearly.    Review of Systems  Constitutional: Negative.   HENT: Negative.   Eyes: Negative.   Respiratory: Negative.   Cardiovascular: Negative.   Gastrointestinal: Negative.   Genitourinary: Negative.   Musculoskeletal: Negative.   Skin: Negative.   Neurological: Negative.   Psychiatric/Behavioral: Negative.        Objective:   Physical Exam  Constitutional: He is oriented to person, place, and time. He appears well-developed and well-nourished. No distress.  HENT:  Head: Normocephalic and atraumatic.  Right Ear: External ear normal.  Left Ear: External ear normal.  Nose: Nose normal.  Mouth/Throat: Oropharynx is clear and moist. No oropharyngeal exudate.  Eyes: Pupils are equal, round, and reactive to light. Conjunctivae and EOM are normal. Right eye exhibits no discharge. Left eye exhibits no discharge. No scleral icterus.  Neck: Neck supple. No JVD present. No tracheal deviation present. No thyromegaly present.  Cardiovascular: Normal rate, regular rhythm, normal heart sounds and intact distal pulses. Exam reveals no gallop and no friction rub.  No murmur heard. Pulmonary/Chest: Effort normal and breath sounds normal. No respiratory distress. He has no wheezes. He has no rales. He exhibits no tenderness.  Abdominal: Soft. Bowel sounds are normal. He exhibits no distension and no mass. There is no tenderness. There is no rebound and no guarding.  Genitourinary: Rectum normal, prostate normal and penis normal. Rectal exam shows guaiac negative stool. No penile tenderness.  Musculoskeletal: Normal range of motion. He exhibits  no edema or tenderness.  Lymphadenopathy:    He has no cervical adenopathy.  Neurological: He is alert and oriented to person, place, and time. He has normal reflexes. He displays normal reflexes. No cranial nerve deficit. He exhibits normal muscle tone. Coordination normal.  Skin: Skin is warm and dry. No rash noted. He is not diaphoretic. No erythema. No pallor.  Psychiatric: He has a normal mood and affect. His behavior is normal. Judgment and thought content normal.          Assessment & Plan:  Well exam. We discussed diet and exercise. Get fasting labs. He is due for another colonoscopy with Dr. Carlean Purl next month, so [name] will contact the GI office to set this up.  Alysia Penna, MD

## 2018-03-06 ENCOUNTER — Telehealth: Payer: Self-pay | Admitting: Family Medicine

## 2018-03-06 NOTE — Telephone Encounter (Signed)
Copied from [place] [id number]. Topic: Quick Communication - Lab Results >> Feb 27, 2018  4:35 PM Myriam Forehand, [place] wrote: Called patient to inform them of  lab results. When patient returns call, triage nurse may disclose results. >> Mar 06, 2018  9:24 AM Keene Breath wrote: Patient is returning call regarding lab results.  NT were busy on calls.  CB# [phone].

## 2018-03-06 NOTE — Telephone Encounter (Signed)
Called and spoke with pt. Pt advised and voiced understanding.  

## 2018-03-06 NOTE — Progress Notes (Signed)
Labs were mailed to pt.

## 2018-03-10 DIAGNOSIS — H35711 Central serous chorioretinopathy, right eye: Secondary | ICD-10-CM | POA: Diagnosis not present

## 2018-04-03 DIAGNOSIS — H903 Sensorineural hearing loss, bilateral: Secondary | ICD-10-CM | POA: Diagnosis not present

## 2018-04-03 DIAGNOSIS — H9113 Presbycusis, bilateral: Secondary | ICD-10-CM | POA: Diagnosis not present

## 2018-04-03 DIAGNOSIS — H833X3 Noise effects on inner ear, bilateral: Secondary | ICD-10-CM | POA: Diagnosis not present

## 2018-04-03 DIAGNOSIS — H9313 Tinnitus, bilateral: Secondary | ICD-10-CM | POA: Diagnosis not present

## 2018-07-02 DIAGNOSIS — J0100 Acute maxillary sinusitis, unspecified: Secondary | ICD-10-CM | POA: Diagnosis not present

## 2018-07-16 ENCOUNTER — Encounter: Payer: Self-pay | Admitting: Internal Medicine

## 2019-04-14 ENCOUNTER — Encounter: Payer: Self-pay | Admitting: Internal Medicine

## 2019-05-04 ENCOUNTER — Ambulatory Visit (AMBULATORY_SURGERY_CENTER): Payer: Self-pay

## 2019-05-04 ENCOUNTER — Encounter: Payer: Self-pay | Admitting: Internal Medicine

## 2019-05-04 ENCOUNTER — Other Ambulatory Visit: Payer: Self-pay

## 2019-05-04 VITALS — Temp 96.8°F | Ht 69.0 in | Wt 185.4 lb

## 2019-05-04 DIAGNOSIS — Z86010 Personal history of colonic polyps: Secondary | ICD-10-CM

## 2019-05-04 NOTE — Progress Notes (Signed)
Denies allergies to eggs or soy products. Denies complication of anesthesia or sedation. Denies use of weight loss medication. Denies use of O2.   Emmi instructions given for colonoscopy.  

## 2019-05-17 ENCOUNTER — Telehealth: Payer: Self-pay | Admitting: Internal Medicine

## 2019-05-17 NOTE — Telephone Encounter (Signed)

## 2019-05-18 ENCOUNTER — Ambulatory Visit (AMBULATORY_SURGERY_CENTER): Payer: 59 | Admitting: Internal Medicine

## 2019-05-18 ENCOUNTER — Encounter: Payer: Self-pay | Admitting: Internal Medicine

## 2019-05-18 ENCOUNTER — Other Ambulatory Visit: Payer: Self-pay

## 2019-05-18 VITALS — BP 122/83 | HR 66 | Temp 97.9°F | Resp 14 | Ht 69.0 in | Wt 185.0 lb

## 2019-05-18 DIAGNOSIS — Z86010 Personal history of colonic polyps: Secondary | ICD-10-CM | POA: Diagnosis present

## 2019-05-18 DIAGNOSIS — D120 Benign neoplasm of cecum: Secondary | ICD-10-CM | POA: Diagnosis not present

## 2019-05-18 DIAGNOSIS — D125 Benign neoplasm of sigmoid colon: Secondary | ICD-10-CM | POA: Diagnosis not present

## 2019-05-18 HISTORY — PX: COLONOSCOPY: SHX174

## 2019-05-18 MED ORDER — SODIUM CHLORIDE 0.9 % IV SOLN: 500.0000 mL | Freq: Once | INTRAVENOUS | Status: DC

## 2019-05-18 NOTE — Progress Notes (Signed)
Pt's states no medical or surgical changes since previsit or office visit.  JB - temp CW - vitals. 

## 2019-05-18 NOTE — Progress Notes (Signed)
Called to room to assist during endoscopic procedure.  Patient ID and intended procedure confirmed with present staff. Received instructions for my participation in the procedure from the performing physician.  

## 2019-05-18 NOTE — Patient Instructions (Addendum)
I found and removed 3 tiny polyps.  You still have diverticulosis - thickened muscle rings and pouches in the colon wall. Please read the handout about this condition.   I will let you know pathology results and when to have another routine colonoscopy by mail and/or My Chart.  I appreciate the opportunity to care for you. Gatha Mayer, MD, FACG  YOU HAD AN ENDOSCOPIC PROCEDURE TODAY AT [facility]:   Refer to the procedure report that was given to you for any specific questions about what was found during the examination.  If the procedure report does not answer your questions, please call your gastroenterologist to clarify.  If you requested that your care partner not be given the details of your procedure findings, then the procedure report has been included in a sealed envelope for you to review at your convenience later.  YOU SHOULD EXPECT: Some feelings of bloating in the abdomen. Passage of more gas than usual.  Walking can help get rid of the air that was put into your GI tract during the procedure and reduce the bloating. If you had a lower endoscopy (such as a colonoscopy or flexible sigmoidoscopy) you may notice spotting of blood in your stool or on the toilet paper. If you underwent a bowel prep for your procedure, you may not have a normal bowel movement for a few days.  Please Note:  You might notice some irritation and congestion in your nose or some drainage.  This is from the oxygen used during your procedure.  There is no need for concern and it should clear up in a day or so.  SYMPTOMS TO REPORT IMMEDIATELY:   Following lower endoscopy (colonoscopy or flexible sigmoidoscopy):  Excessive amounts of blood in the stool  Significant tenderness or worsening of abdominal pains  Swelling of the abdomen that is new, acute  Fever of 100F or higher   For urgent or emergent issues, a gastroenterologist can be reached at any hour by calling [phone]  [phone].   DIET:  We do recommend a small meal at first, but then you may proceed to your regular diet.  Drink plenty of fluids but you should avoid alcoholic beverages for 24 hours.  ACTIVITY:  You should plan to take it easy for the rest of today and you should NOT DRIVE or use heavy machinery until tomorrow (because of the sedation medicines used during the test).    FOLLOW UP: Our staff will call the number listed on your records 48-72 hours following your procedure to check on you and address any questions or concerns that you may have regarding the information given to you following your procedure. If we do not reach you, we will leave a message.  We will attempt to reach you two times.  During this call, we will ask if you have developed any symptoms of COVID 19. If you develop any symptoms (ie: fever, flu-like symptoms, shortness of breath, cough etc.) before then, please call [phone].  If you test positive for Covid 19 in the 2 weeks post procedure, please call and report this information to [place].    If any biopsies were taken you will be contacted by phone or by letter within the next 1-3 weeks.  Please call us at [phone] if you have not heard about the biopsies in 3 weeks.    SIGNATURES/CONFIDENTIALITY: You and/or your care partner have signed paperwork which will be entered into your electronic medical record.  These signatures attest to the fact that that the information above on your After Visit Summary has been reviewed and is understood.  Full responsibility of the confidentiality of this discharge information lies with you and/or your care-partner.

## 2019-05-18 NOTE — Op Note (Signed)
[address] Patient Name: Brandon Cochran Procedure Date: 05/18/2019 10:50 AM MRN: [id number] Endoscopist: Gatha Mayer , MD Age: 58 Referring MD:  Date of Birth: 08-14-1960 Gender: Male Account #: [id number] Procedure:                Colonoscopy Indications:              Surveillance: Personal history of adenomatous                            polyps on last colonoscopy > 5 years ago Medicines:                Propofol per Anesthesia, Monitored Anesthesia Care Procedure:                Pre-Anesthesia Assessment:                           - Prior to the procedure, a History and Physical                            was performed, and patient medications and                            allergies were reviewed. The patient's tolerance of                            previous anesthesia was also reviewed. The risks                            and benefits of the procedure and the sedation                            options and risks were discussed with the patient.                            All questions were answered, and informed consent                            was obtained. Prior Anticoagulants: The patient has                            taken no previous anticoagulant or antiplatelet                            agents. ASA Grade Assessment: II - A patient with                            mild systemic disease. After reviewing the risks                            and benefits, the patient was deemed in                            satisfactory condition to undergo the procedure.  After obtaining informed consent, the colonoscope                            was passed under direct vision. Throughout the                            procedure, the patient's blood pressure, pulse, and                            oxygen saturations were monitored continuously. The                            Colonoscope was introduced through the anus and   advanced to the the cecum, identified by                            appendiceal orifice and ileocecal valve. The                            colonoscopy was performed without difficulty. The                            patient tolerated the procedure well. The quality                            of the bowel preparation was good. The bowel                            preparation used was Miralax via split dose                            instruction. The ileocecal valve, appendiceal                            orifice, and rectum were photographed. Scope In: 11:10:41 AM Scope Out: 11:28:12 AM Scope Withdrawal Time: 0 hours 15 minutes 6 seconds  Total Procedure Duration: 0 hours 17 minutes 31 seconds  Findings:                 The perianal and digital rectal examinations were                            normal. Pertinent negatives include normal prostate                            (size, shape, and consistency).                           Two sessile polyps were found in the sigmoid colon.                            The polyps were diminutive in size. These polyps                            were removed with a  cold snare. Resection and                            retrieval were complete. Verification of patient                            identification for the specimen was done. Estimated                            blood loss was minimal.                           A less than 1 mm polyp was found in the cecum. The                            polyp was flat. The polyp was removed with a cold                            biopsy forceps. Resection and retrieval were                            complete. Verification of patient identification                            for the specimen was done. Estimated blood loss was                            minimal.                           Multiple diverticula were found in the sigmoid                            colon and descending colon.                            The exam was otherwise without abnormality on                            direct and retroflexion views. Complications:            No immediate complications. Estimated Blood Loss:     Estimated blood loss was minimal. Impression:               - Two diminutive polyps in the sigmoid colon,                            removed with a cold snare. Resected and retrieved.                           - One less than 1 mm polyp in the cecum, removed                            with a cold biopsy forceps. Resected and retrieved.                           -  Diverticulosis in the sigmoid colon and in the                            descending colon.                           - The examination was otherwise normal on direct                            and retroflexion views.                           - Personal history of colonic polyp - diminutive                            adenoma 2014. Recommendation:           - Patient has a contact number available for                            emergencies. The signs and symptoms of potential                            delayed complications were discussed with the                            patient. Return to normal activities tomorrow.                            Written discharge instructions were provided to the                            patient.                           - Resume previous diet.                           - Continue present medications.                           - Repeat colonoscopy is recommended for                            surveillance. The colonoscopy date will be                            determined after pathology results from today's                            exam become available for review. Gatha Mayer, MD 05/18/2019 11:35:36 AM This report has been signed electronically.

## 2019-05-18 NOTE — Progress Notes (Signed)
A/ox3, pleased with MAC, report to RN 

## 2019-05-20 ENCOUNTER — Telehealth: Payer: Self-pay

## 2019-05-20 NOTE — Telephone Encounter (Signed)
Voicemail is full unable to leave message

## 2019-05-20 NOTE — Telephone Encounter (Signed)
  Follow up Call-  Call back number 05/18/2019  Post procedure Call Back phone  # [phone]  Permission to leave phone message Yes  Some recent data might be hidden     Patient questions:  Do you have a fever, pain , or abdominal swelling? No. Pain Score  0 *  Have you tolerated food without any problems? Yes.    Have you been able to return to your normal activities? Yes.    Do you have any questions about your discharge instructions: Diet   No. Medications  No. Follow up visit  No.  Do you have questions or concerns about your Care? No.  Actions: * If pain score is 4 or above: No action needed, pain <4.  1. Have you developed a fever since your procedure? No  2.   Have you had an respiratory symptoms (SOB or cough) since your procedure? No  3.   Have you tested positive for COVID 19 since your procedure No  4.   Have you had any family members/close contacts diagnosed with the COVID 19 since your procedure?  No   If yes to any of these questions please route to Joylene John, RN and Alphonsa Gin, RN.

## 2019-05-26 ENCOUNTER — Encounter: Payer: Self-pay | Admitting: Internal Medicine

## 2019-05-26 NOTE — Progress Notes (Signed)
Diminutive adenoma Recall 2027

## 2019-08-11 ENCOUNTER — Other Ambulatory Visit: Payer: Self-pay | Admitting: Dermatology

## 2020-01-05 ENCOUNTER — Ambulatory Visit: Payer: 59 | Attending: Internal Medicine

## 2020-01-05 DIAGNOSIS — Z23 Encounter for immunization: Secondary | ICD-10-CM

## 2020-01-05 NOTE — Progress Notes (Signed)
   Covid-19 Vaccination Clinic  Name:  Brandon Cochran    MRN: [id number] DOB: 1960-12-07  01/05/2020  [name] was observed post Covid-19 immunization for 15 minutes without incident. He was provided with Vaccine Information Sheet and instruction to access the V-Safe system.   [name] was instructed to call 911 with any severe reactions post vaccine: Marland Kitchen Difficulty breathing  . Swelling of face and throat  . A fast heartbeat  . A bad rash all over body  . Dizziness and weakness   Immunizations Administered    Name Date Dose VIS Date Route   JANSSEN COVID-19 VACCINE 01/05/2020 12:45 PM 0.5 mL 10/09/2019 Intramuscular   Manufacturer: Alphonsa Overall   Lot: [id number]   [place]: [id number]

## 2020-02-29 ENCOUNTER — Other Ambulatory Visit: Payer: Self-pay

## 2020-02-29 ENCOUNTER — Ambulatory Visit: Payer: 59 | Admitting: Family Medicine

## 2020-02-29 ENCOUNTER — Encounter: Payer: Self-pay | Admitting: Family Medicine

## 2020-02-29 VITALS — BP 124/70 | HR 78 | Temp 98.9°F | Wt 182.6 lb

## 2020-02-29 DIAGNOSIS — R350 Frequency of micturition: Secondary | ICD-10-CM | POA: Diagnosis not present

## 2020-02-29 DIAGNOSIS — N410 Acute prostatitis: Secondary | ICD-10-CM | POA: Diagnosis not present

## 2020-02-29 LAB — POCT URINALYSIS DIPSTICK
Bilirubin, UA: NEGATIVE
Blood, UA: NEGATIVE
Glucose, UA: NEGATIVE
Ketones, UA: NEGATIVE
Leukocytes, UA: NEGATIVE
Nitrite, UA: NEGATIVE
Protein, UA: NEGATIVE
Spec Grav, UA: 1.015 (ref 1.010–1.025)
Urobilinogen, UA: 0.2 E.U./dL
pH, UA: 6 (ref 5.0–8.0)

## 2020-02-29 MED ORDER — DOXYCYCLINE HYCLATE 100 MG PO CAPS: 100.0000 mg | ORAL_CAPSULE | Freq: Two times a day (BID) | ORAL | 0 refills | Status: AC

## 2020-02-29 NOTE — Progress Notes (Signed)
   Subjective:    Patient ID: Brandon Cochran, male    DOB: 03-Jul-1961, 59 y.o.   MRN: [id number]  HPI Here for 2 weeks of intermittent mild pain in the right lower back and in the right groin area. Now for 2 days he also has increased urgency to urinate and nocturia times 2 or 3. He is unable to ejaculate. No blood has been seen. No fever or nausea. He has a hx of kidney stones but the pain he gets with those is much more severe than this has been. He is drinking lots of water.    Review of Systems  Constitutional: Negative.   Respiratory: Negative.   Cardiovascular: Negative.   Gastrointestinal: Negative.   Genitourinary: Positive for flank pain, frequency and urgency. Negative for difficulty urinating, dysuria, hematuria and testicular pain.  Musculoskeletal: Positive for back pain.       Objective:   Physical Exam Constitutional:      General: He is not in acute distress.    Appearance: Normal appearance.  Cardiovascular:     Rate and Rhythm: Normal rate and regular rhythm.     Pulses: Normal pulses.     Heart sounds: Normal heart sounds.  Pulmonary:     Effort: Pulmonary effort is normal.     Breath sounds: Normal breath sounds.  Abdominal:     General: Abdomen is flat. Bowel sounds are normal. There is no distension.     Palpations: Abdomen is soft. There is no mass.     Tenderness: There is no abdominal tenderness. There is no guarding or rebound.     Hernia: No hernia is present.  Genitourinary:    Penis: Normal.      Testes: Normal.     Comments: Prostate is mildly swollen and tender  Neurological:     Mental Status: He is alert.           Assessment & Plan:  He likely has a prostatitis, treat with Doxycycline. Culture the sample.  Alysia Penna, MD

## 2020-03-01 LAB — URINE CULTURE
MICRO NUMBER:: 10727427
Result:: NO GROWTH
SPECIMEN QUALITY:: ADEQUATE

## 2020-03-16 ENCOUNTER — Ambulatory Visit (INDEPENDENT_AMBULATORY_CARE_PROVIDER_SITE_OTHER): Payer: 59 | Admitting: Family Medicine

## 2020-03-16 ENCOUNTER — Encounter: Payer: Self-pay | Admitting: Family Medicine

## 2020-03-16 ENCOUNTER — Other Ambulatory Visit: Payer: Self-pay

## 2020-03-16 VITALS — BP 120/62 | HR 63 | Temp 97.8°F | Wt 183.6 lb

## 2020-03-16 DIAGNOSIS — Z0000 Encounter for general adult medical examination without abnormal findings: Secondary | ICD-10-CM | POA: Diagnosis not present

## 2020-03-16 DIAGNOSIS — J300 Vasomotor rhinitis: Secondary | ICD-10-CM | POA: Diagnosis not present

## 2020-03-16 LAB — HEPATIC FUNCTION PANEL
AG Ratio: 2.4 (calc) (ref 1.0–2.5)
ALT: 22 U/L (ref 9–46)
AST: 20 U/L (ref 10–35)
Albumin: 4.7 g/dL (ref 3.6–5.1)
Alkaline phosphatase (APISO): 68 U/L (ref 35–144)
Bilirubin, Direct: 0.2 mg/dL (ref 0.0–0.2)
Globulin: 2 g/dL (calc) (ref 1.9–3.7)
Indirect Bilirubin: 0.8 mg/dL (calc) (ref 0.2–1.2)
Total Bilirubin: 1 mg/dL (ref 0.2–1.2)
Total Protein: 6.7 g/dL (ref 6.1–8.1)

## 2020-03-16 LAB — BASIC METABOLIC PANEL
BUN: 12 mg/dL (ref 7–25)
CO2: 30 mmol/L (ref 20–32)
Calcium: 9.3 mg/dL (ref 8.6–10.3)
Chloride: 103 mmol/L (ref 98–110)
Creat: 1.08 mg/dL (ref 0.70–1.25)
Glucose, Bld: 77 mg/dL (ref 65–99)
Potassium: 4.2 mmol/L (ref 3.5–5.3)
Sodium: 141 mmol/L (ref 135–146)

## 2020-03-16 LAB — LIPID PANEL
Cholesterol: 211 mg/dL — ABNORMAL HIGH (ref <200)
HDL: 43 mg/dL (ref >=40)
LDL Cholesterol (Calc): 137 mg/dL (calc) — ABNORMAL HIGH
Non-HDL Cholesterol (Calc): 168 mg/dL (calc) — ABNORMAL HIGH (ref <130)
Total CHOL/HDL Ratio: 4.9 (calc) (ref <5.0)
Triglycerides: 170 mg/dL — ABNORMAL HIGH (ref <150)

## 2020-03-16 LAB — CBC WITH DIFFERENTIAL/PLATELET
Absolute Monocytes: 592 cells/uL (ref 200–950)
Basophils Absolute: 61 cells/uL (ref 0–200)
Basophils Relative: 1 %
Eosinophils Absolute: 128 cells/uL (ref 15–500)
Eosinophils Relative: 2.1 %
HCT: 44.5 % (ref 38.5–50.0)
Hemoglobin: 15.4 g/dL (ref 13.2–17.1)
Lymphs Abs: 2288 cells/uL (ref 850–3900)
MCH: 31 pg (ref 27.0–33.0)
MCHC: 34.6 g/dL (ref 32.0–36.0)
MCV: 89.7 fL (ref 80.0–100.0)
MPV: 11.5 fL (ref 7.5–12.5)
Monocytes Relative: 9.7 %
Neutro Abs: 3032 cells/uL (ref 1500–7800)
Neutrophils Relative %: 49.7 %
Platelets: 203 10*3/uL (ref 140–400)
RBC: 4.96 10*6/uL (ref 4.20–5.80)
RDW: 13.4 % (ref 11.0–15.0)
Total Lymphocyte: 37.5 %
WBC: 6.1 10*3/uL (ref 3.8–10.8)

## 2020-03-16 LAB — TSH: TSH: 2.75 mIU/L (ref 0.40–4.50)

## 2020-03-16 MED ORDER — AZELASTINE HCL 0.1 % NA SOLN: 2.0000 | Freq: Two times a day (BID) | NASAL | 5 refills | Status: DC

## 2020-03-16 NOTE — Progress Notes (Signed)
Subjective:    Patient ID: Brandon Cochran, male    DOB: April 07, 1961, 59 y.o.   MRN: [id number]  HPI Here for a well exam. He has one concern and that is nasal congestion every night in bed. He had used Flonase with some success but at a recent eye exam he was told his intraocular pressures are mildly elevated. He was told to stop using any steroid nasal sprays. Otherwise we recently treated him for a prostate infection with Doxycycline, and this has completely resolved.    Review of Systems  Constitutional: Negative.   HENT: Positive for congestion.   Eyes: Negative.   Respiratory: Negative.   Cardiovascular: Negative.   Gastrointestinal: Negative.   Genitourinary: Negative.   Musculoskeletal: Negative.   Skin: Negative.   Neurological: Negative.   Psychiatric/Behavioral: Negative.        Objective:   Physical Exam Constitutional:      General: He is not in acute distress.    Appearance: He is well-developed. He is not diaphoretic.  HENT:     Head: Normocephalic and atraumatic.     Right Ear: External ear normal.     Left Ear: External ear normal.     Nose: Nose normal.     Mouth/Throat:     Pharynx: No oropharyngeal exudate.  Eyes:     General: No scleral icterus.       Right eye: No discharge.        Left eye: No discharge.     Conjunctiva/sclera: Conjunctivae normal.     Pupils: Pupils are equal, round, and reactive to light.  Neck:     Thyroid: No thyromegaly.     Vascular: No JVD.     Trachea: No tracheal deviation.  Cardiovascular:     Rate and Rhythm: Normal rate and regular rhythm.     Heart sounds: Normal heart sounds. No murmur heard.  No friction rub. No gallop.   Pulmonary:     Effort: Pulmonary effort is normal. No respiratory distress.     Breath sounds: Normal breath sounds. No wheezing or rales.  Chest:     Chest wall: No tenderness.  Abdominal:     General: Bowel sounds are normal. There is no distension.     Palpations: Abdomen is soft.  There is no mass.     Tenderness: There is no abdominal tenderness. There is no guarding or rebound.  Genitourinary:    Penis: No tenderness.   Musculoskeletal:        General: No tenderness. Normal range of motion.     Cervical back: Neck supple.  Lymphadenopathy:     Cervical: No cervical adenopathy.  Skin:    General: Skin is warm and dry.     Coloration: Skin is not pale.     Findings: No erythema or rash.  Neurological:     Mental Status: He is alert and oriented to person, place, and time.     Cranial Nerves: No cranial nerve deficit.     Motor: No abnormal muscle tone.     Coordination: Coordination normal.     Deep Tendon Reflexes: Reflexes are normal and symmetric. Reflexes normal.  Psychiatric:        Behavior: Behavior normal.        Thought Content: Thought content normal.        Judgment: Judgment normal.           Assessment & Plan:  Well exam. We discussed diet and exercise. Get  fasting labs. Since he recently had a prostate infection we will wait another 30 days before we draw a PSA level. 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  Alysia Penna, MD

## 2021-04-09 ENCOUNTER — Other Ambulatory Visit: Payer: Self-pay | Admitting: Otolaryngology

## 2021-04-09 DIAGNOSIS — J329 Chronic sinusitis, unspecified: Secondary | ICD-10-CM

## 2021-04-12 ENCOUNTER — Other Ambulatory Visit: Payer: Self-pay

## 2021-04-12 ENCOUNTER — Encounter: Payer: Self-pay | Admitting: Family Medicine

## 2021-04-12 ENCOUNTER — Ambulatory Visit (INDEPENDENT_AMBULATORY_CARE_PROVIDER_SITE_OTHER): Payer: 59 | Admitting: Family Medicine

## 2021-04-12 VITALS — BP 120/78 | HR 54 | Temp 97.9°F | Ht 68.25 in | Wt 180.0 lb

## 2021-04-12 DIAGNOSIS — Z0000 Encounter for general adult medical examination without abnormal findings: Secondary | ICD-10-CM | POA: Diagnosis not present

## 2021-04-12 DIAGNOSIS — R1011 Right upper quadrant pain: Secondary | ICD-10-CM

## 2021-04-12 LAB — TSH: TSH: 2.37 u[IU]/mL (ref 0.35–5.50)

## 2021-04-12 LAB — CBC WITH DIFFERENTIAL/PLATELET
Basophils Absolute: 0.1 10*3/uL (ref 0.0–0.1)
Basophils Relative: 1.3 % (ref 0.0–3.0)
Eosinophils Absolute: 0.2 10*3/uL (ref 0.0–0.7)
Eosinophils Relative: 3.3 % (ref 0.0–5.0)
HCT: 44.8 % (ref 39.0–52.0)
Hemoglobin: 15.5 g/dL (ref 13.0–17.0)
Lymphocytes Relative: 40.1 % (ref 12.0–46.0)
Lymphs Abs: 1.9 10*3/uL (ref 0.7–4.0)
MCHC: 34.6 g/dL (ref 30.0–36.0)
MCV: 88 fl (ref 78.0–100.0)
Monocytes Absolute: 0.5 10*3/uL (ref 0.1–1.0)
Monocytes Relative: 9.9 % (ref 3.0–12.0)
Neutro Abs: 2.1 10*3/uL (ref 1.4–7.7)
Neutrophils Relative %: 45.4 % (ref 43.0–77.0)
Platelets: 220 10*3/uL (ref 150.0–400.0)
RBC: 5.09 Mil/uL (ref 4.22–5.81)
RDW: 13.5 % (ref 11.5–15.5)
WBC: 4.7 10*3/uL (ref 4.0–10.5)

## 2021-04-12 LAB — HEMOGLOBIN A1C: Hgb A1c MFr Bld: 5.5 % (ref 4.6–6.5)

## 2021-04-12 LAB — HEPATIC FUNCTION PANEL
ALT: 24 U/L (ref 0–53)
AST: 26 U/L (ref 0–37)
Albumin: 4.7 g/dL (ref 3.5–5.2)
Alkaline Phosphatase: 71 U/L (ref 39–117)
Bilirubin, Direct: 0.2 mg/dL (ref 0.0–0.3)
Total Bilirubin: 0.9 mg/dL (ref 0.2–1.2)
Total Protein: 7 g/dL (ref 6.0–8.3)

## 2021-04-12 LAB — LIPID PANEL
Cholesterol: 201 mg/dL — ABNORMAL HIGH (ref 0–200)
HDL: 47.2 mg/dL (ref >39.00)
LDL Cholesterol: 139 mg/dL — ABNORMAL HIGH (ref 0–99)
NonHDL: 153.99
Total CHOL/HDL Ratio: 4
Triglycerides: 73 mg/dL (ref 0.0–149.0)
VLDL: 14.6 mg/dL (ref 0.0–40.0)

## 2021-04-12 LAB — BASIC METABOLIC PANEL
BUN: 19 mg/dL (ref 6–23)
CO2: 28 mEq/L (ref 19–32)
Calcium: 9.5 mg/dL (ref 8.4–10.5)
Chloride: 102 mEq/L (ref 96–112)
Creatinine, Ser: 1.21 mg/dL (ref 0.40–1.50)
GFR: 64.71 mL/min (ref >60.00)
Glucose, Bld: 80 mg/dL (ref 70–99)
Potassium: 4.5 mEq/L (ref 3.5–5.1)
Sodium: 138 mEq/L (ref 135–145)

## 2021-04-12 LAB — PSA: PSA: 3.95 ng/mL (ref 0.10–4.00)

## 2021-04-12 MED ORDER — AZELASTINE HCL 0.1 % NA SOLN: 2.0000 | Freq: Two times a day (BID) | NASAL | 5 refills | Status: DC

## 2021-04-12 NOTE — Progress Notes (Signed)
Subjective:    Patient ID: Brandon Cochran, male    DOB: [birth year], 60 y.o.   MRN: [id number]  HPI Here for a well exam. He has a few issues to discuss. 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 He sees Dr. Rush Farmer for orthopedic care, and he told [name] that the orthopedic group is expecting a new partner to joint them soon who might be better able to help [name]. I suspect this means the new partner can doa wrist replacement surgery, but we don't know for sure. He also sees Dr. Benjamine Mola for chronic sinus congestion, and he has ordered a sinus CT scan sometime soon. [name] also describes a mild sharp RUQ pain that comes and goes over the past year. This is worst after he eats a meal. No nausea or fever.    Review of Systems  Constitutional: Negative.   HENT:  Positive for congestion.   Eyes: Negative.   Respiratory: Negative.    Cardiovascular: Negative.   Gastrointestinal:  Positive for abdominal pain.  Genitourinary: Negative.   Musculoskeletal:  Positive for arthralgias.  Skin: Negative.   Neurological: Negative.   Psychiatric/Behavioral: Negative.        Objective:   Physical Exam Constitutional:      General: He is not in acute distress.    Appearance: Normal appearance. He is well-developed. He is not diaphoretic.  HENT:     Head: Normocephalic and atraumatic.     Right Ear: External ear normal.     Left Ear: External ear normal.     Nose: Nose normal.     Mouth/Throat:     Pharynx: No oropharyngeal exudate.  Eyes:     General: No scleral icterus.       Right eye: No discharge.        Left eye: No discharge.     Conjunctiva/sclera: Conjunctivae normal.     Pupils: Pupils are equal, round, and reactive to light.  Neck:     Thyroid: No thyromegaly.     Vascular: No JVD.     Trachea: No tracheal deviation.  Cardiovascular:     Rate and Rhythm: Normal rate and regular rhythm.     Heart sounds: Normal heart  sounds. No murmur heard.   No friction rub. No gallop.  Pulmonary:     Effort: Pulmonary effort is normal. No respiratory distress.     Breath sounds: Normal breath sounds. No wheezing or rales.  Chest:     Chest wall: No tenderness.  Abdominal:     General: Bowel sounds are normal. There is no distension.     Palpations: Abdomen is soft. There is no mass.     Tenderness: There is no abdominal tenderness. There is no guarding or rebound.  Genitourinary:    Penis: Normal. No tenderness.      Testes: Normal.     Prostate: Normal.     Rectum: Normal. Guaiac result negative.  Musculoskeletal:        General: No tenderness. Normal range of motion.     Cervical back: Neck supple.  Lymphadenopathy:     Cervical: No cervical adenopathy.  Skin:    General: Skin is warm and dry.     Coloration: Skin is not pale.     Findings: No erythema or rash.  Neurological:     Mental Status: He is alert and oriented to person, place, and time.  Cranial Nerves: No cranial nerve deficit.     Motor: No abnormal muscle tone.     Coordination: Coordination normal.     Deep Tendon Reflexes: Reflexes are normal and symmetric. Reflexes normal.  Psychiatric:        Behavior: Behavior normal.        Thought Content: Thought content normal.        Judgment: Judgment normal.           Assessment & Plan:  Well exam. We discussed diet and exercise. Get fasting labs. We will set up an abdominal US to assess the RUQ pain. Gall bladder disease is a possibility.  Alysia Penna, MD

## 2021-04-26 ENCOUNTER — Ambulatory Visit
Admission: RE | Admit: 2021-04-26 | Discharge: 2021-04-26 | Disposition: A | Discharge status: Home / Self Care | Payer: 59 | Source: Ambulatory Visit | Attending: Family Medicine | Admitting: Family Medicine

## 2021-04-26 ENCOUNTER — Other Ambulatory Visit: Payer: Self-pay

## 2021-04-26 ENCOUNTER — Ambulatory Visit
Admission: RE | Admit: 2021-04-26 | Discharge: 2021-04-26 | Disposition: A | Discharge status: Home / Self Care | Payer: 59 | Source: Ambulatory Visit | Attending: Otolaryngology | Admitting: Otolaryngology

## 2021-04-26 DIAGNOSIS — R1011 Right upper quadrant pain: Secondary | ICD-10-CM

## 2021-04-26 DIAGNOSIS — J329 Chronic sinusitis, unspecified: Secondary | ICD-10-CM

## 2021-07-26 ENCOUNTER — Ambulatory Visit: Payer: Self-pay

## 2021-07-26 ENCOUNTER — Ambulatory Visit: Payer: 59 | Admitting: Orthopedic Surgery

## 2021-07-26 ENCOUNTER — Other Ambulatory Visit: Payer: Self-pay

## 2021-07-26 DIAGNOSIS — M1812 Unilateral primary osteoarthritis of first carpometacarpal joint, left hand: Secondary | ICD-10-CM

## 2021-07-26 DIAGNOSIS — M19031 Primary osteoarthritis, right wrist: Secondary | ICD-10-CM | POA: Diagnosis not present

## 2021-07-26 DIAGNOSIS — M79642 Pain in left hand: Secondary | ICD-10-CM | POA: Diagnosis not present

## 2021-07-26 DIAGNOSIS — M79641 Pain in right hand: Secondary | ICD-10-CM

## 2021-07-26 MED ORDER — MELOXICAM 7.5 MG PO TABS: 7.5000 mg | ORAL_TABLET | Freq: Every day | ORAL | 0 refills | Status: AC

## 2021-07-26 NOTE — Progress Notes (Signed)
Office Visit Note   Patient: Brandon Cochran           Date of Birth: [birth year]           MRN: [id number] Visit Date: 07/26/2021              Requested by: Laurey Morale, MD [place],  [place] [id number] PCP: Laurey Morale, MD   Assessment & Plan: Visit Diagnoses:  1. Bilateral hand pain     Plan: We discussed the diagnosis, prognosis, non-operative and operative treatment options for both wrist and thumb osteoarthritis.  X-rays of both hands taken today demonstrate significant radiocarpal arthritis on the right with involvement of the midcarpal joint and the capital lunate articulation.  On the left side, radiographs demonstrate osteoarthritis at both the [facility] and MP joints of the thumb.  After our discussion, the patient would like to proceed with a prescription for oral anti-inflammatory medication and continued bracing.  We reviewed the risks and benefits of conservative management.  The patient expressed understanding of the reasoning and strategy going forward.  All patient questions and concerns were addressed.    Follow-Up Instructions: No follow-ups on file.   Orders:  Orders Placed This Encounter  Procedures   XR Hand Complete Right   XR Hand Complete Left   Meds ordered this encounter  Medications   meloxicam (MOBIC) 7.5 MG tablet    Sig: Take 1 tablet (7.5 mg total) by mouth daily.    Dispense:  30 tablet    Refill:  0      Procedures: No procedures performed   Clinical Data: No additional findings.   Subjective: Chief Complaint  Patient presents with   Right Hand - Pain   Left Hand - Pain    This is a 60 year old right-hand-dominant male who presents with pain in the right wrist and the left thumb at the [facility] and MP joints.  His right wrist pains been going on for several years now.  Is been worsening.  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  He wears a brace when he works on his  cars that keeps his wrist in extension.  This helped significantly while he is working.  His pain is worse with extension of the wrist.  He has pain at night at rest when he is not working.  He has tried corticosteroids in this wrist previously with questionable improvement.  Pain is dull and aching and centered at the dorsal aspect of the wrist.  On the left side he has pain about the [facility] and MP joints of the thumb.  The pain is worse with pinching activities.  This is been a more recent issue for him and his wrist.  He has not tried anything for this left thumb pain.   Review of Systems   Objective: Vital Signs: There were no vitals taken for this visit.  Physical Exam Constitutional:      Appearance: Normal appearance.  Cardiovascular:     Rate and Rhythm: Normal rate.     Pulses: Normal pulses.  Pulmonary:     Effort: Pulmonary effort is normal.  Skin:    General: Skin is warm and dry.     Capillary Refill: Capillary refill takes less than 2 seconds.  Neurological:     Mental Status: He is alert.    Right Hand Exam   Tenderness  Right hand tenderness location: Minimal  tenderness to palpation across wrist joint.  Other  Erythema: absent Sensation: normal Pulse: present  Comments:  Wrist ROM 35/25 (70/30 on left).  Mild to moderate wrist swelling without erythema.  No pain at DRUJ.  Full and painless ROM of fingers.    Left Hand Exam   Tenderness  Left hand tenderness location: TTP at thumb CMC and MP joints.   Other  Erythema: absent Sensation: normal Pulse: present  Comments:  + CMC grind test w/ crepitus.  Pain w/ ROM of MP joint without hyper-extension.  No pain w/ ROM of wrist.      Specialty Comments:  No specialty comments available.  Imaging: 3 views of the right wrist taken today reviewed interpreted by me.  They demonstrate significant radiocarpal osteoarthritis with joint space narrowing between the radial lunate and radius scaphoid articulations.   There appears to be joint space narrowing with subchondral sclerosis at the head of the capitate suggesting midcarpal arthritis.  There is some degenerative changes at the thumb [facility] joint but much less severe than on the left side.  3 views of the left hand taken today reviewed interpreted by me.  They demonstrate osteoarthritis of the thumb CMC joint with joint space narrowing and osteophyte formation.  Is also osteoarthritis of the MP joint of the thumb with similar joint space narrowing and large osteophytes.  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 Mosetta Anis    Arthritis    BPH with elevated PSA    sees Dr. Risa Grill   Personal history of colonic adenoma 04/14/2013   Sinus headache    Urosepsis August 2012    Family History  Problem Relation Age of Onset   Lung cancer Father    Colon cancer Neg Hx    Esophageal cancer Neg Hx    Rectal cancer Neg Hx    Stomach cancer Neg Hx     Past Surgical History:  Procedure Laterality Date   COLONOSCOPY  05/18/2019   per Dr. Carlean Purl, adenomatous polyps, repeat in 55 yrs    [place]   right inguinal    NASAL SEPTUM SURGERY  3875   per Dr. Erik Obey    PROSTATE BIOPSY  04-08-11   per Dr. Risa Grill    Social History   Occupational History   Not on file  Tobacco Use   Smoking status: Never   Smokeless tobacco: Never  Substance and Sexual Activity   Alcohol use: No    Alcohol/week: 0.0 standard drinks   Drug use: No   Sexual activity: Not on file

## 2021-07-31 ENCOUNTER — Ambulatory Visit: Payer: 59 | Admitting: Physician Assistant

## 2021-09-19 ENCOUNTER — Other Ambulatory Visit: Payer: Self-pay

## 2021-09-19 ENCOUNTER — Encounter: Payer: Self-pay | Admitting: Physician Assistant

## 2021-09-19 ENCOUNTER — Ambulatory Visit: Payer: Self-pay

## 2021-09-19 ENCOUNTER — Ambulatory Visit: Payer: 59 | Admitting: Physician Assistant

## 2021-09-19 DIAGNOSIS — M25561 Pain in right knee: Secondary | ICD-10-CM | POA: Diagnosis not present

## 2021-09-19 MED ORDER — METHYLPREDNISOLONE ACETATE 40 MG/ML IJ SUSP
40.0000 mg | INTRAMUSCULAR | Status: AC | PRN
  Administered 2021-09-19: 40 mg via INTRA_ARTICULAR

## 2021-09-19 MED ORDER — LIDOCAINE HCL 1 % IJ SOLN
3.0000 mL | INTRAMUSCULAR | Status: AC | PRN
  Administered 2021-09-19: 3 mL

## 2021-09-19 NOTE — Progress Notes (Signed)
Office Visit Note   Patient: Brandon Cochran           Date of Birth: 10-31-1960           MRN: [id number] Visit Date: 09/19/2021              Requested by: Laurey Morale, MD [place],  [place] [address] PCP: Laurey Morale, MD   Assessment & Plan: Visit Diagnoses:  1. Chronic pain of right knee     Plan: He will work on quad strengthening exercises.  We will see him back in 2 weeks to see how he is doing overall.  Questions were encouraged and answered at length.  Follow-Up Instructions: Return in about 2 weeks (around 10/03/2021).   Orders:  Orders Placed This Encounter  Procedures   Large Joint Inj: R knee   XR Knee 1-2 Views Right   No orders of the defined types were placed in this encounter.     Procedures: Large Joint Inj: R knee on 09/19/2021 2:49 PM Indications: pain Details: 22 G 1.5 in needle, anterolateral approach  Arthrogram: No  Medications: 3 mL lidocaine 1 %; 40 mg methylPREDNISolone acetate 40 MG/ML Outcome: tolerated well, no immediate complications Procedure, treatment alternatives, risks and benefits explained, specific risks discussed. Consent was given by the patient. Immediately prior to procedure a time out was called to verify the correct patient, procedure, equipment, support staff and site/side marked as required. Patient was prepped and draped in the usual sterile fashion.      Clinical Data: No additional findings.   Subjective: Chief Complaint  Patient presents with   Right Knee - Pain    HPI Brandon Cochran is a 61 year old male with right knee pain for the past 3 months no known injury.  He is having medial knee pain.  Worse whenever he is sitting hangs his leg in a recliner.  He is tried meloxicam which helped he was actually taking this for his hands and found that it helped with his knee pain but he had some GI upset and had to stop the meloxicam.  He denies any giving way catching locking or painful popping  involving the right knee.  He does note that he had some swelling in the knee. He is nondiabetic. Review of Systems See HPI.  Otherwise negative.  Objective: Vital Signs: There were no vitals taken for this visit.  Physical Exam Constitutional:      Appearance: He is not ill-appearing or diaphoretic.  Pulmonary:     Effort: Pulmonary effort is normal.  Neurological:     Mental Status: He is alert and oriented to person, place, and time.  Psychiatric:        Mood and Affect: Mood normal.    Ortho Exam Bilateral knees good range of motion of both knees.  No abnormal warmth erythema or effusion of either knee.  No instability valgus varus stressing of either knee.  Tenderness along medial joint line of the right knee.  Slight patellofemoral crepitus with passive range of motion of the right knee.  Calves are supple and nontender. Specialty Comments:  No specialty comments available.  Imaging: XR Knee 1-2 Views Right  Result Date: 09/19/2021 Right knee 2 views: No bony abnormalities or lesions.  Narrowing medial joint line consistent with moderate narrowing.  Mild patella femoral arthritic changes.  Lateral compartment well-preserved.  Knee is well located.  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 Mosetta Anis    Arthritis    BPH with elevated PSA    sees Dr. Risa Grill   Personal history of colonic adenoma 04/14/2013   Sinus headache    Urosepsis August 2012    Family History  Problem Relation Age of Onset   Lung cancer Father    Colon cancer Neg Hx    Esophageal cancer Neg Hx    Rectal cancer Neg Hx    Stomach cancer Neg Hx     Past  Surgical History:  Procedure Laterality Date   COLONOSCOPY  05/18/2019   per Dr. Carlean Purl, adenomatous polyps, repeat in 55 yrs    [place]   right inguinal    NASAL SEPTUM SURGERY  0761   per Dr. Erik Obey    PROSTATE BIOPSY  04-08-11   per Dr. Risa Grill    Social History   Occupational History   Not on file  Tobacco Use   Smoking status: Never   Smokeless tobacco: Never  Substance and Sexual Activity   Alcohol use: No    Alcohol/week: 0.0 standard drinks   Drug use: No   Sexual activity: Not on file

## 2021-10-03 ENCOUNTER — Ambulatory Visit: Payer: 59 | Admitting: Physician Assistant

## 2022-04-19 IMAGING — US US ABDOMEN COMPLETE
1 series · 14 of 25 positions shown · non-contrast
Comparison: None.

CLINICAL DATA: Abdominal pain.

EXAM:
ABDOMEN ULTRASOUND COMPLETE

[Series 1: us abdomen complete · 0.20mm/px · 14 of 36 slices shown]
[im 1/36]
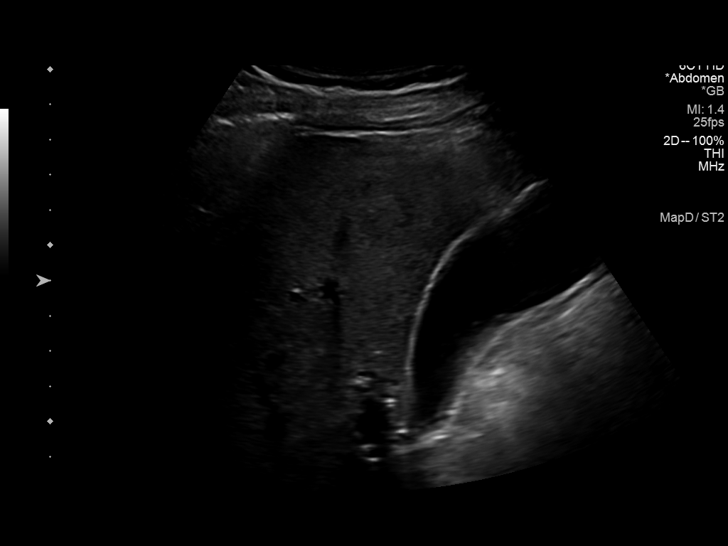
[im 3/36]
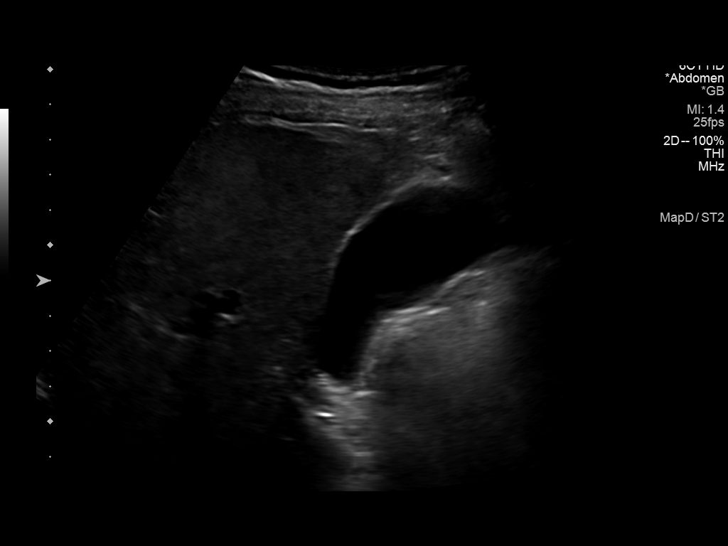
[im 6/36]
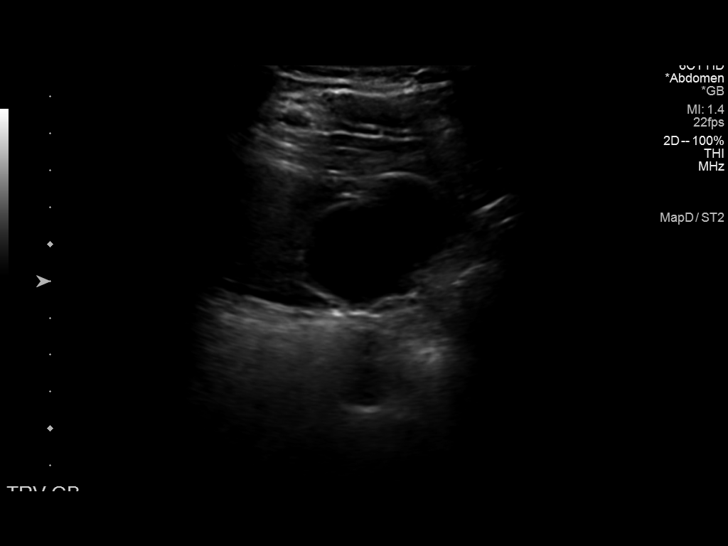
[im 9/36]
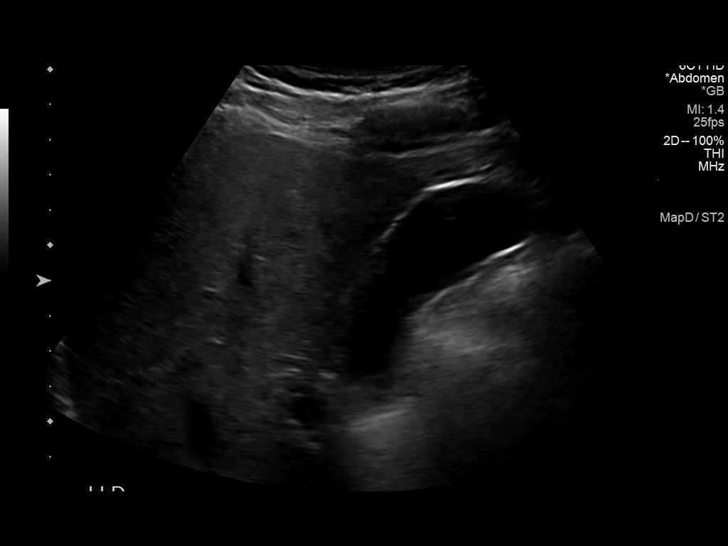
[im 12/36]
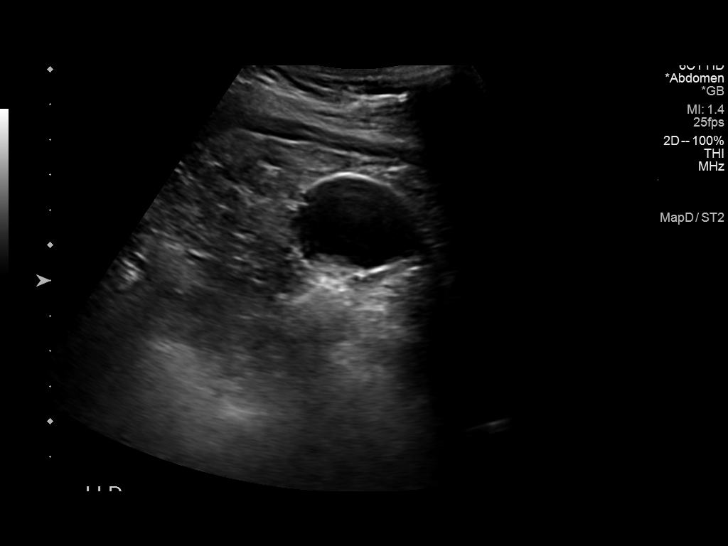
[im 14/36]
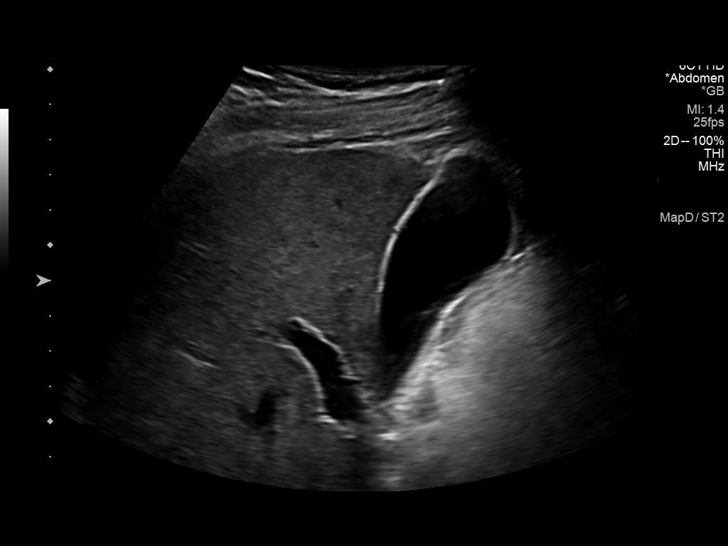
[im 17/36]
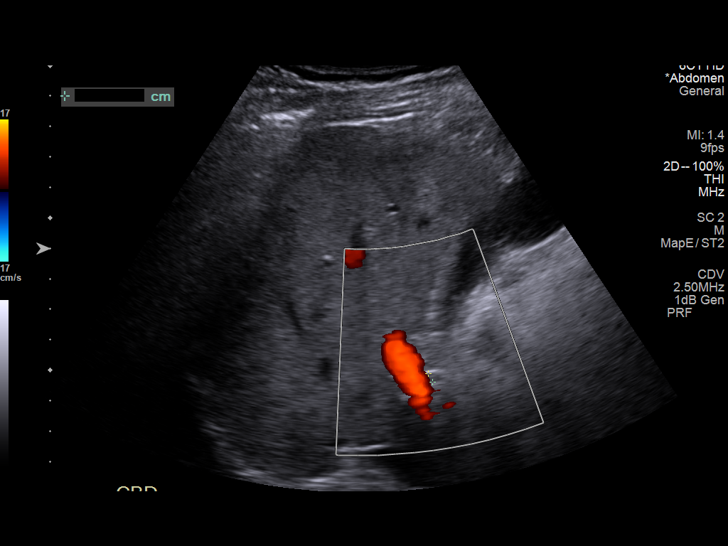
[im 19/36]
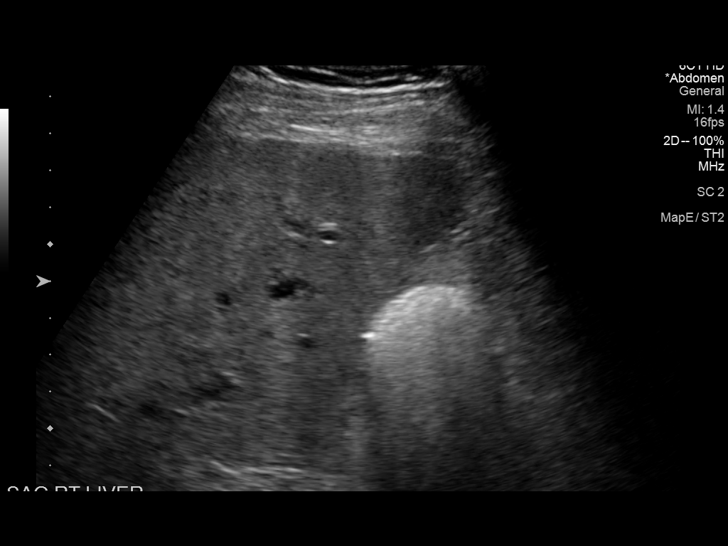
[im 22/36]
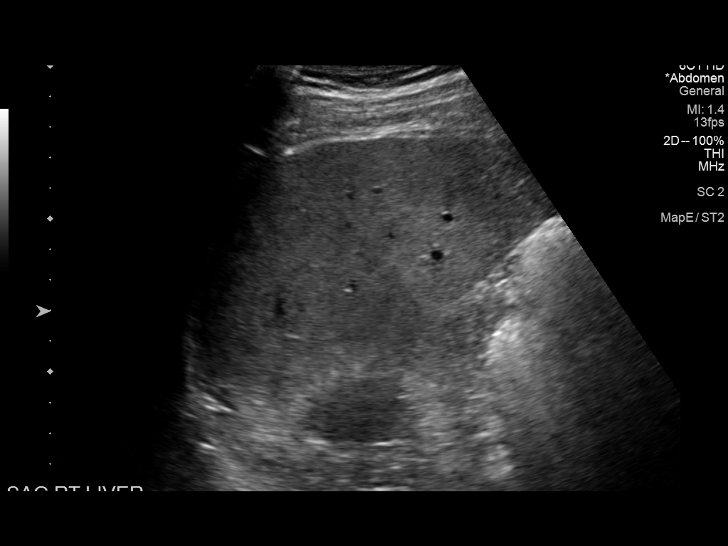
[im 24/36]
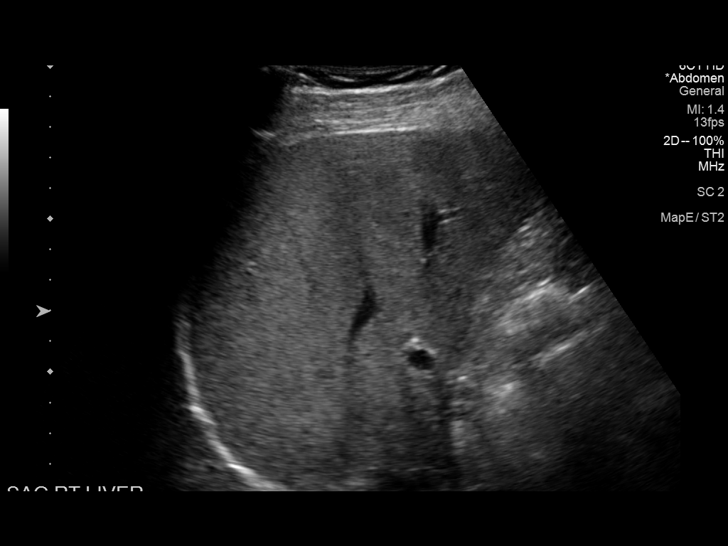
[im 27/36]
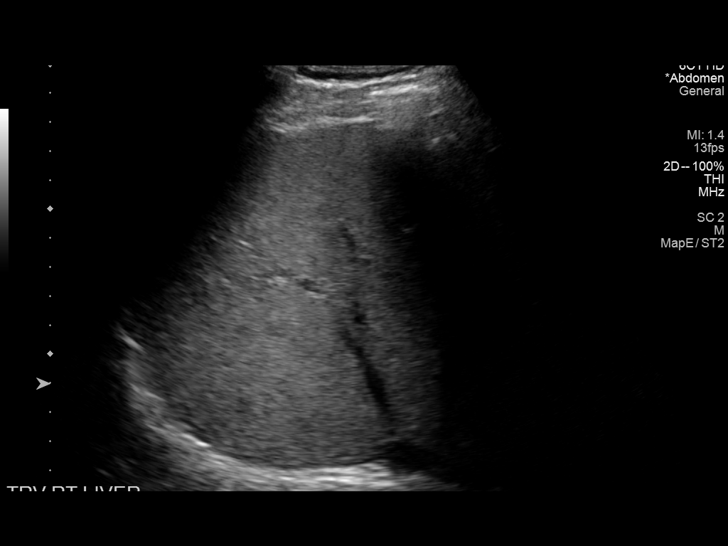
[im 30/36]
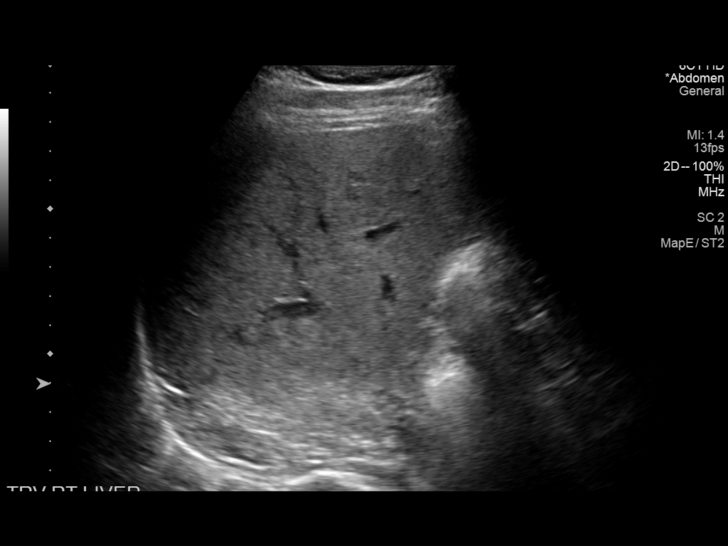
[im 33/36]
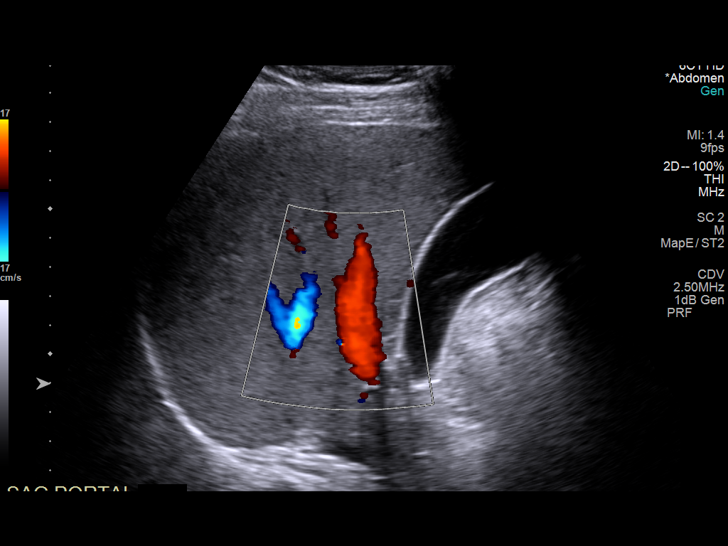
[im 36/36]
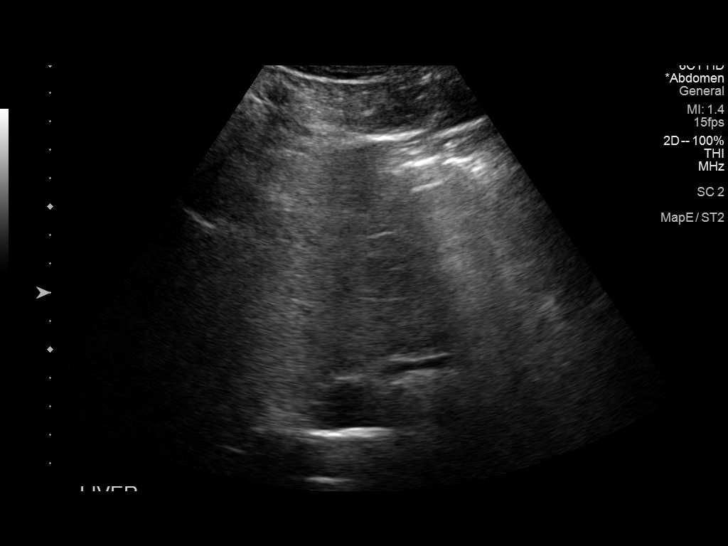

[14 of 25 positions shown; findings below may reference images not displayed]

FINDINGS: Gallbladder: No gallstones or wall thickening visualized. No
sonographic Murphy sign noted by sonographer.

Common bile duct: Diameter: 3 mm

Liver: The liver is partially visualized and suboptimally evaluated.
There is diffuse increased liver echogenicity most commonly seen in
the setting of fatty infiltration. Superimposed inflammation or
fibrosis is not excluded. Clinical correlation is recommended.
Portal vein is patent on color Doppler imaging with normal direction
of blood flow towards the liver.

IVC: No abnormality visualized.

Pancreas: The pancreas is poorly visualized obscured by bowel gas.

Spleen: Size and appearance within normal limits.

Right Kidney: Length: 11.0 cm. Echogenicity within normal limits. No
mass or hydronephrosis visualized.

Left Kidney: Length: 10.4 cm. Echogenicity within normal limits. No
mass or hydronephrosis visualized.

Abdominal aorta: No aneurysm visualized.

Other findings: None.
IMPRESSION: Fatty liver, otherwise unremarkable abdominal ultrasound.

## 2022-06-24 ENCOUNTER — Ambulatory Visit (INDEPENDENT_AMBULATORY_CARE_PROVIDER_SITE_OTHER): Payer: 59 | Admitting: Physician Assistant

## 2022-06-24 ENCOUNTER — Encounter: Payer: Self-pay | Admitting: Physician Assistant

## 2022-06-24 DIAGNOSIS — M1812 Unilateral primary osteoarthritis of first carpometacarpal joint, left hand: Secondary | ICD-10-CM

## 2022-06-24 NOTE — Progress Notes (Unsigned)
   Procedure Note  Patient: Brandon Cochran             Date of Birth: March 20, 1961           MRN: [id number]             Visit Date: 06/24/2022  HPI: Mr. Brandon Cochran 61 year old male who is known to Dr. Trevor Cochran service.  Comes in today due to left thumb pain.  He last saw Dr. Tempie Cochran on 07/26/2021 due to to left thumb pain and right wrist pain.  He comes in today asking for left thumb injection.  He states that he was given Mobic and while it helped with his thumb pain it upset his stomach.  He has been using Advil and ice and also some Voltaren gel which he states made his muscles sore.  Denies any new injury to the left thumb.  He points to the left thumb metacarpal phalangeal joint as the area of maximal tenderness.  Review of systems: Nondiabetic.  No fevers or chills.  Left thumb negative grind test.  Tenderness over the left thumb MP joint.  Good range of motion but with discomfort of the left thumb at the MP joint.  No triggering left thumb.  Procedures: Visit Diagnoses:  1. Degenerative arthritis of thumb, left     Hand/UE Inj for osteoarthritis on 06/24/2022 5:09 PM Medications: 0.5 mL lidocaine 1 %; 20 mg methylPREDNISolone acetate 40 MG/ML   Plan: He will follow-up as needed.  Did discuss with him he can take Mobic or the Advil but would not take both.  And periodically take Mobic if that is more beneficial to him.  Questions were encouraged and answered

## 2022-06-25 MED ORDER — METHYLPREDNISOLONE ACETATE 40 MG/ML IJ SUSP
20.0000 mg | INTRAMUSCULAR | Status: AC | PRN
  Administered 2022-06-24: 20 mg

## 2022-06-25 MED ORDER — LIDOCAINE HCL 1 % IJ SOLN
0.5000 mL | INTRAMUSCULAR | Status: AC | PRN
  Administered 2022-06-24: .5 mL

## 2022-10-17 ENCOUNTER — Encounter: Payer: Self-pay | Admitting: Radiology

## 2023-04-08 ENCOUNTER — Encounter: Payer: Self-pay | Admitting: Family Medicine

## 2023-04-08 ENCOUNTER — Other Ambulatory Visit: Payer: Self-pay

## 2023-04-08 ENCOUNTER — Ambulatory Visit (INDEPENDENT_AMBULATORY_CARE_PROVIDER_SITE_OTHER): Payer: 59 | Admitting: Family Medicine

## 2023-04-08 VITALS — BP 118/68 | HR 62 | Temp 98.1°F | Ht 68.5 in | Wt 181.0 lb

## 2023-04-08 DIAGNOSIS — M79645 Pain in left finger(s): Secondary | ICD-10-CM

## 2023-04-08 DIAGNOSIS — G8929 Other chronic pain: Secondary | ICD-10-CM

## 2023-04-08 DIAGNOSIS — L989 Disorder of the skin and subcutaneous tissue, unspecified: Secondary | ICD-10-CM

## 2023-04-08 DIAGNOSIS — Z0000 Encounter for general adult medical examination without abnormal findings: Secondary | ICD-10-CM

## 2023-04-08 DIAGNOSIS — R972 Elevated prostate specific antigen [PSA]: Secondary | ICD-10-CM

## 2023-04-08 DIAGNOSIS — Z23 Encounter for immunization: Secondary | ICD-10-CM

## 2023-04-08 LAB — CBC WITH DIFFERENTIAL/PLATELET
Basophils Absolute: 0.1 10*3/uL (ref 0.0–0.1)
Basophils Relative: 0.8 % (ref 0.0–3.0)
Eosinophils Absolute: 0.1 10*3/uL (ref 0.0–0.7)
Eosinophils Relative: 2 % (ref 0.0–5.0)
HCT: 47.3 % (ref 39.0–52.0)
Hemoglobin: 15.8 g/dL (ref 13.0–17.0)
Lymphocytes Relative: 37.5 % (ref 12.0–46.0)
Lymphs Abs: 2.4 10*3/uL (ref 0.7–4.0)
MCHC: 33.4 g/dL (ref 30.0–36.0)
MCV: 89.2 fl (ref 78.0–100.0)
Monocytes Absolute: 0.5 10*3/uL (ref 0.1–1.0)
Monocytes Relative: 8.4 % (ref 3.0–12.0)
Neutro Abs: 3.3 10*3/uL (ref 1.4–7.7)
Neutrophils Relative %: 51.3 % (ref 43.0–77.0)
Platelets: 258 10*3/uL (ref 150.0–400.0)
RBC: 5.31 Mil/uL (ref 4.22–5.81)
RDW: 13.6 % (ref 11.5–15.5)
WBC: 6.5 10*3/uL (ref 4.0–10.5)

## 2023-04-08 LAB — HEPATIC FUNCTION PANEL
ALT: 26 U/L (ref 0–53)
AST: 21 U/L (ref 0–37)
Albumin: 4.5 g/dL (ref 3.5–5.2)
Alkaline Phosphatase: 70 U/L (ref 39–117)
Bilirubin, Direct: 0.1 mg/dL (ref 0.0–0.3)
Total Bilirubin: 0.8 mg/dL (ref 0.2–1.2)
Total Protein: 6.8 g/dL (ref 6.0–8.3)

## 2023-04-08 LAB — HEMOGLOBIN A1C: Hgb A1c MFr Bld: 5.6 % (ref 4.6–6.5)

## 2023-04-08 LAB — BASIC METABOLIC PANEL
BUN: 13 mg/dL (ref 6–23)
CO2: 28 mEq/L (ref 19–32)
Calcium: 9.4 mg/dL (ref 8.4–10.5)
Chloride: 102 mEq/L (ref 96–112)
Creatinine, Ser: 1.09 mg/dL (ref 0.40–1.50)
GFR: 72.34 mL/min (ref >60.00)
Glucose, Bld: 81 mg/dL (ref 70–99)
Potassium: 4.2 mEq/L (ref 3.5–5.1)
Sodium: 141 mEq/L (ref 135–145)

## 2023-04-08 LAB — LIPID PANEL
Cholesterol: 201 mg/dL — ABNORMAL HIGH (ref 0–200)
HDL: 41.8 mg/dL (ref >39.00)
LDL Cholesterol: 121 mg/dL — ABNORMAL HIGH (ref 0–99)
NonHDL: 159.59
Total CHOL/HDL Ratio: 5
Triglycerides: 195 mg/dL — ABNORMAL HIGH (ref 0.0–149.0)
VLDL: 39 mg/dL (ref 0.0–40.0)

## 2023-04-08 LAB — TSH: TSH: 3.45 u[IU]/mL (ref 0.35–5.50)

## 2023-04-08 LAB — PSA: PSA: 5.74 ng/mL — ABNORMAL HIGH (ref 0.10–4.00)

## 2023-04-08 MED ORDER — AZELASTINE HCL 0.1 % NA SOLN: 2.0000 | Freq: Two times a day (BID) | NASAL | 5 refills | Status: DC

## 2023-04-08 NOTE — Progress Notes (Signed)
Subjective:    Patient ID: Brandon Cochran, male    DOB: [birth year], 62 y.o.   MRN: [id number]  HPI Here for a well exam. He has a few issues to discuss. First he has had pain in the base of the left thumb for the past 2 years, and this has been diagnosed as arthritis. He has tried applying heat and ice, he has taken Ibuprofen and Meloxicam, and he has applied Voltaren gel with no relief. He saw someone at Dr. Doroteo Glassman office a year ago and they gave him a cortisone injection, but this did not help either. He asks for a referral to a hand specialist. Also he asks for a referral to a dermatologist for a skin exam. Finally he complains of a slow urine stream, of incomplete bladder emptying, and of getting up to urinate at night.    Review of Systems  Constitutional: Negative.   HENT: Negative.    Eyes: Negative.   Respiratory: Negative.    Cardiovascular: Negative.   Gastrointestinal: Negative.   Genitourinary:  Positive for difficulty urinating.  Musculoskeletal:  Positive for arthralgias.  Skin: Negative.   Neurological: Negative.   Psychiatric/Behavioral: Negative.         Objective:   Physical Exam Constitutional:      General: He is not in acute distress.    Appearance: Normal appearance. He is well-developed. He is not diaphoretic.  HENT:     Head: Normocephalic and atraumatic.     Right Ear: External ear normal.     Left Ear: External ear normal.     Nose: Nose normal.     Mouth/Throat:     Pharynx: No oropharyngeal exudate.  Eyes:     General: No scleral icterus.       Right eye: No discharge.        Left eye: No discharge.     Conjunctiva/sclera: Conjunctivae normal.     Pupils: Pupils are equal, round, and reactive to light.  Neck:     Thyroid: No thyromegaly.     Vascular: No JVD.     Trachea: No tracheal deviation.  Cardiovascular:     Rate and Rhythm: Normal rate and regular rhythm.     Pulses: Normal pulses.     Heart sounds: Normal heart sounds. No  murmur heard.    No friction rub. No gallop.  Pulmonary:     Effort: Pulmonary effort is normal. No respiratory distress.     Breath sounds: Normal breath sounds. No wheezing or rales.  Chest:     Chest wall: No tenderness.  Abdominal:     General: Bowel sounds are normal. There is no distension.     Palpations: Abdomen is soft. There is no mass.     Tenderness: There is no abdominal tenderness. There is no guarding or rebound.  Genitourinary:    Penis: Normal. No tenderness.      Testes: Normal.     Prostate: Normal.     Rectum: Normal. Guaiac result negative.  Musculoskeletal:        General: Normal range of motion.     Cervical back: Neck supple.     Comments: The left great CMC joint is tender and has a lot of crepitus   Lymphadenopathy:     Cervical: No cervical adenopathy.  Skin:    General: Skin is warm and dry.     Coloration: Skin is not pale.     Findings: No erythema or rash.  Neurological:  General: No focal deficit present.     Mental Status: He is alert and oriented to person, place, and time.     Cranial Nerves: No cranial nerve deficit.     Motor: No abnormal muscle tone.     Coordination: Coordination normal.     Deep Tendon Reflexes: Reflexes are normal and symmetric. Reflexes normal.  Psychiatric:        Mood and Affect: Mood normal.        Behavior: Behavior normal.        Thought Content: Thought content normal.        Judgment: Judgment normal.           Assessment & Plan:  Well exam. We discussed diet and exercise. Get fasting labs. He has some BPH, so he will try OTC saw palmetto. We will refer him to Hand Surgery for the left thumb, and he has advanced OA in this joint. We will also refer him to Dermatology for a skin exam.  Gershon Crane, MD

## 2023-04-08 NOTE — Addendum Note (Signed)
Addended by: Carola Rhine on: 04/08/2023 11:48 AM   Modules accepted: Orders

## 2023-04-21 ENCOUNTER — Telehealth: Payer: Self-pay | Admitting: Family Medicine

## 2023-04-21 ENCOUNTER — Other Ambulatory Visit: Payer: Self-pay

## 2023-04-21 DIAGNOSIS — R972 Elevated prostate specific antigen [PSA]: Secondary | ICD-10-CM

## 2023-04-21 NOTE — Telephone Encounter (Signed)
Pt is calling and has some questions concerning his blood work results. Pt has viewed results on mychart

## 2023-04-21 NOTE — Addendum Note (Signed)
Addended by: Elwin Mocha on: 04/21/2023 10:54 AM   Modules accepted: Orders

## 2023-04-22 NOTE — Telephone Encounter (Signed)
Pt was notified of lab results on 04/21/23

## 2023-05-09 ENCOUNTER — Other Ambulatory Visit (INDEPENDENT_AMBULATORY_CARE_PROVIDER_SITE_OTHER): Payer: 59

## 2023-05-09 ENCOUNTER — Telehealth: Payer: Self-pay | Admitting: Family Medicine

## 2023-05-09 DIAGNOSIS — R972 Elevated prostate specific antigen [PSA]: Secondary | ICD-10-CM | POA: Diagnosis not present

## 2023-05-09 LAB — PSA: PSA: 5.1 ng/mL — ABNORMAL HIGH (ref 0.10–4.00)

## 2023-05-09 NOTE — Addendum Note (Signed)
Addended by: Gershon Crane A on: 05/09/2023 01:07 PM   Modules accepted: Orders

## 2023-05-11 ENCOUNTER — Encounter: Payer: Self-pay | Admitting: Family Medicine

## 2023-05-11 DIAGNOSIS — R972 Elevated prostate specific antigen [PSA]: Secondary | ICD-10-CM

## 2023-05-12 NOTE — Telephone Encounter (Signed)
Error/njr °

## 2023-05-13 NOTE — Telephone Encounter (Signed)
I think it would be reasonable to treat this first with 10 days of Doxycycline 100 mg BID, and then recheck another PSA. Please call this in and order the test

## 2023-05-21 MED ORDER — DOXYCYCLINE HYCLATE 100 MG PO TABS: 100.0000 mg | ORAL_TABLET | Freq: Two times a day (BID) | ORAL | 0 refills | Status: DC

## 2023-05-23 ENCOUNTER — Telehealth: Payer: Self-pay | Admitting: Family Medicine

## 2023-05-23 ENCOUNTER — Other Ambulatory Visit: Payer: Self-pay

## 2023-05-23 MED ORDER — DOXYCYCLINE HYCLATE 100 MG PO TABS: 100.0000 mg | ORAL_TABLET | Freq: Two times a day (BID) | ORAL | 0 refills | Status: AC

## 2023-05-23 NOTE — Telephone Encounter (Addendum)
Pt called to say he prefers the Rx for the doxycycline doxycycline (VIBRA-TABS) 100 MG tablet be sent to:  [facility] - [place], [place] - [address] [phone] [address] [place] [address]

## 2023-05-23 NOTE — Telephone Encounter (Signed)
Rx sent per pt reequest

## 2023-06-11 ENCOUNTER — Other Ambulatory Visit (INDEPENDENT_AMBULATORY_CARE_PROVIDER_SITE_OTHER): Payer: 59

## 2023-06-11 DIAGNOSIS — R972 Elevated prostate specific antigen [PSA]: Secondary | ICD-10-CM | POA: Diagnosis not present

## 2023-06-11 LAB — PSA: PSA: 5.59 ng/mL — ABNORMAL HIGH (ref 0.10–4.00)

## 2024-04-02 ENCOUNTER — Other Ambulatory Visit: Payer: Self-pay | Admitting: Urology

## 2024-04-02 DIAGNOSIS — R972 Elevated prostate specific antigen [PSA]: Secondary | ICD-10-CM

## 2024-04-05 ENCOUNTER — Encounter: Payer: Self-pay | Admitting: Urology

## 2024-04-19 ENCOUNTER — Telehealth: Payer: Self-pay | Admitting: *Deleted

## 2024-04-19 NOTE — Telephone Encounter (Signed)
 Copied from CRM #[id number]. Topic: Clinical - Lab/Test Results >> Apr 19, 2024  8:41 AM Emylou G wrote: Reason for CRM: Please call patient wants to know if he needs labs before his phys?  He said its been awhile.  Also if he needs to check numbers on liver based on prev appt?

## 2024-04-19 NOTE — Telephone Encounter (Signed)
 Left detailed message for pt advised that Dr Johnny will send him for blood work after his appointment with Dr Johnny.

## 2024-04-20 ENCOUNTER — Encounter: Admitting: Family Medicine

## 2024-04-30 ENCOUNTER — Ambulatory Visit: Payer: Self-pay | Admitting: Family Medicine

## 2024-04-30 ENCOUNTER — Telehealth: Payer: Self-pay | Admitting: *Deleted

## 2024-04-30 ENCOUNTER — Encounter: Payer: Self-pay | Admitting: Family Medicine

## 2024-04-30 ENCOUNTER — Other Ambulatory Visit: Payer: Self-pay

## 2024-04-30 ENCOUNTER — Ambulatory Visit (INDEPENDENT_AMBULATORY_CARE_PROVIDER_SITE_OTHER): Admitting: Family Medicine

## 2024-04-30 VITALS — BP 110/74 | HR 54 | Temp 98.1°F | Resp 16 | Ht 68.5 in | Wt 177.6 lb

## 2024-04-30 DIAGNOSIS — Z0000 Encounter for general adult medical examination without abnormal findings: Secondary | ICD-10-CM

## 2024-04-30 DIAGNOSIS — R1011 Right upper quadrant pain: Secondary | ICD-10-CM

## 2024-04-30 LAB — CBC WITH DIFFERENTIAL/PLATELET
Basophils Absolute: 0.1 K/uL (ref 0.0–0.1)
Basophils Relative: 1.1 % (ref 0.0–3.0)
Eosinophils Absolute: 0.1 K/uL (ref 0.0–0.7)
Eosinophils Relative: 2 % (ref 0.0–5.0)
HCT: 45.5 % (ref 39.0–52.0)
Hemoglobin: 15.6 g/dL (ref 13.0–17.0)
Lymphocytes Relative: 33.8 % (ref 12.0–46.0)
Lymphs Abs: 2 K/uL (ref 0.7–4.0)
MCHC: 34.2 g/dL (ref 30.0–36.0)
MCV: 88.3 fl (ref 78.0–100.0)
Monocytes Absolute: 0.5 K/uL (ref 0.1–1.0)
Monocytes Relative: 9 % (ref 3.0–12.0)
Neutro Abs: 3.2 K/uL (ref 1.4–7.7)
Neutrophils Relative %: 54.1 % (ref 43.0–77.0)
Platelets: 218 K/uL (ref 150.0–400.0)
RBC: 5.15 Mil/uL (ref 4.22–5.81)
RDW: 13.7 % (ref 11.5–15.5)
WBC: 5.9 K/uL (ref 4.0–10.5)

## 2024-04-30 LAB — HEPATIC FUNCTION PANEL
ALT: 25 U/L (ref 0–53)
AST: 23 U/L (ref 0–37)
Albumin: 4.8 g/dL (ref 3.5–5.2)
Alkaline Phosphatase: 66 U/L (ref 39–117)
Bilirubin, Direct: 0.2 mg/dL (ref 0.0–0.3)
Total Bilirubin: 0.9 mg/dL (ref 0.2–1.2)
Total Protein: 7.1 g/dL (ref 6.0–8.3)

## 2024-04-30 LAB — BASIC METABOLIC PANEL WITH GFR
BUN: 13 mg/dL (ref 6–23)
CO2: 29 meq/L (ref 19–32)
Calcium: 9.8 mg/dL (ref 8.4–10.5)
Chloride: 104 meq/L (ref 96–112)
Creatinine, Ser: 1.01 mg/dL (ref 0.40–1.50)
GFR: 78.68 mL/min (ref >60.00)
Glucose, Bld: 86 mg/dL (ref 70–99)
Potassium: 4.3 meq/L (ref 3.5–5.1)
Sodium: 141 meq/L (ref 135–145)

## 2024-04-30 LAB — HEMOGLOBIN A1C: Hgb A1c MFr Bld: 5.9 % (ref 4.6–6.5)

## 2024-04-30 LAB — LIPID PANEL
Cholesterol: 208 mg/dL — ABNORMAL HIGH (ref 0–200)
HDL: 45.7 mg/dL (ref >39.00)
LDL Cholesterol: 140 mg/dL — ABNORMAL HIGH (ref 0–99)
NonHDL: 161.84
Total CHOL/HDL Ratio: 5
Triglycerides: 109 mg/dL (ref 0.0–149.0)
VLDL: 21.8 mg/dL (ref 0.0–40.0)

## 2024-04-30 LAB — TSH: TSH: 2.19 u[IU]/mL (ref 0.35–5.50)

## 2024-04-30 NOTE — Progress Notes (Signed)
 Subjective:    Patient ID: Brandon Cochran, male    DOB: 07/18/1961, 63 y.o.   MRN: [id number]  HPI Here for a well exam. He feels fine in general but he again mentions the mild RUQ abdominal pains that come and go. These started a few years ago, and they have never changed since then, he had an US  in 2020 that showed a fatty liver, but it was otherwise unremarkable. He is seeing Dr. Cam for an elevated PSA, and he is scheduled for a prostate MRI in November.   Review of Systems  Constitutional: Negative.   HENT: Negative.    Eyes: Negative.   Respiratory: Negative.    Cardiovascular: Negative.   Gastrointestinal:  Positive for abdominal pain. Negative for abdominal distention, blood in stool, constipation, diarrhea, nausea and vomiting.  Genitourinary: Negative.   Musculoskeletal: Negative.   Skin: Negative.   Neurological: Negative.   Psychiatric/Behavioral: Negative.         Objective:   Physical Exam Constitutional:      General: He is not in acute distress.    Appearance: Normal appearance. He is well-developed. He is not diaphoretic.  HENT:     Head: Normocephalic and atraumatic.     Right Ear: External ear normal.     Left Ear: External ear normal.     Nose: Nose normal.     Mouth/Throat:     Pharynx: No oropharyngeal exudate.  Eyes:     General: No scleral icterus.       Right eye: No discharge.        Left eye: No discharge.     Conjunctiva/sclera: Conjunctivae normal.     Pupils: Pupils are equal, round, and reactive to light.  Neck:     Thyroid : No thyromegaly.     Vascular: No JVD.     Trachea: No tracheal deviation.  Cardiovascular:     Rate and Rhythm: Normal rate and regular rhythm.     Pulses: Normal pulses.     Heart sounds: Normal heart sounds. No murmur heard.    No friction rub. No gallop.  Pulmonary:     Effort: Pulmonary effort is normal. No respiratory distress.     Breath sounds: Normal breath sounds. No wheezing or rales.  Chest:      Chest wall: No tenderness.  Abdominal:     General: Bowel sounds are normal. There is no distension.     Palpations: Abdomen is soft. There is no mass.     Tenderness: There is no guarding or rebound.     Comments: He is very mildly tender in the RUQ  Genitourinary:    Penis: No tenderness.   Musculoskeletal:        General: No tenderness. Normal range of motion.     Cervical back: Neck supple.  Lymphadenopathy:     Cervical: No cervical adenopathy.  Skin:    General: Skin is warm and dry.     Coloration: Skin is not pale.     Findings: No erythema or rash.  Neurological:     General: No focal deficit present.     Mental Status: He is alert and oriented to person, place, and time.     Cranial Nerves: No cranial nerve deficit.     Motor: No abnormal muscle tone.     Coordination: Coordination normal.     Deep Tendon Reflexes: Reflexes are normal and symmetric. Reflexes normal.  Psychiatric:  Mood and Affect: Mood normal.        Behavior: Behavior normal.        Thought Content: Thought content normal.        Judgment: Judgment normal.           Assessment & Plan:  Well exam. We discussed diet and exercise. Get fasting labs. He will follo wup with Dr. Cam for the prostate workup. We will set up an abdominal CT scan for the RUQ pains.  Garnette Olmsted, MD

## 2024-04-30 NOTE — Telephone Encounter (Signed)
 We are just searching

## 2024-04-30 NOTE — Telephone Encounter (Signed)
 Spoke with an agent advise for the CT order change to CT Abdomen/ Pelvic without Contrast if the diagnoses id searching for kidney stones. Message sent to Dr Johnny for advise

## 2024-04-30 NOTE — Telephone Encounter (Signed)
 Copied from CRM [id number]. Topic: General - Other >> Apr 30, 2024 10:53 AM Berneda FALCON wrote: Reason for CRM: Janella with [facility] would like to know if PCP suspects a stone or are we just searching and the stone just may be there. If he suspects the stone, it would change the order to be without contrast, but if just searching we can leave the order as is.   Her callback is [phone]  Extension 1035 (she is leaving at 1 today) if she is gone, choose option 1 and 4 to get someone else.

## 2024-05-03 ENCOUNTER — Telehealth: Payer: Self-pay | Admitting: Family Medicine

## 2024-05-03 NOTE — Progress Notes (Signed)
I changed the order to without contrast.

## 2024-05-03 NOTE — Telephone Encounter (Signed)
 Panzy with  imaging is calling and ct abd pelvis needs to be change to without contrast pt has kidney stones

## 2024-05-04 NOTE — Telephone Encounter (Signed)
 Noted

## 2024-05-04 NOTE — Telephone Encounter (Signed)
 I already changed this to without contrast and it is scheduled for Oct. 10

## 2024-05-17 ENCOUNTER — Ambulatory Visit
Admission: RE | Admit: 2024-05-17 | Discharge: 2024-05-17 | Disposition: A | Discharge status: Home / Self Care | Source: Ambulatory Visit | Attending: Family Medicine | Admitting: Family Medicine

## 2024-05-17 DIAGNOSIS — R1011 Right upper quadrant pain: Secondary | ICD-10-CM

## 2024-05-18 ENCOUNTER — Ambulatory Visit: Payer: Self-pay | Admitting: Family Medicine

## 2024-06-04 ENCOUNTER — Other Ambulatory Visit: Payer: Self-pay | Admitting: Family Medicine

## 2024-06-29 ENCOUNTER — Ambulatory Visit
Admission: RE | Admit: 2024-06-29 | Discharge: 2024-06-29 | Disposition: A | Discharge status: Home / Self Care | Source: Ambulatory Visit | Attending: Urology | Admitting: Urology

## 2024-06-29 DIAGNOSIS — R972 Elevated prostate specific antigen [PSA]: Secondary | ICD-10-CM

## 2024-06-29 MED ORDER — GADOPICLENOL 0.5 MMOL/ML IV SOLN
8.0000 mL | Freq: Once | INTRAVENOUS | Status: AC | PRN
  Administered 2024-06-29: 8 mL via INTRAVENOUS
